# Patient Record
Sex: Female | Born: 1975 | Race: White | Hispanic: No | Marital: Married | Smoking: Current every day smoker
Health system: Southern US, Community
[De-identification: ages and names within clinical notes are randomized; demographics above are authoritative.]

## PROBLEM LIST (undated history)

## (undated) DIAGNOSIS — G43909 Migraine, unspecified, not intractable, without status migrainosus: Secondary | ICD-10-CM

## (undated) DIAGNOSIS — K219 Gastro-esophageal reflux disease without esophagitis: Secondary | ICD-10-CM

## (undated) DIAGNOSIS — G510 Bell's palsy: Secondary | ICD-10-CM

## (undated) DIAGNOSIS — G600 Hereditary motor and sensory neuropathy: Secondary | ICD-10-CM

## (undated) DIAGNOSIS — I10 Essential (primary) hypertension: Secondary | ICD-10-CM

## (undated) HISTORY — PX: KNEE SURGERY: SHX244

## (undated) HISTORY — PX: TONSILLECTOMY: SUR1361

## (undated) HISTORY — PX: APPENDECTOMY: SHX54

---

## 2007-09-11 ENCOUNTER — Emergency Department: Payer: Self-pay | Admitting: Unknown Physician Specialty

## 2008-07-08 ENCOUNTER — Ambulatory Visit: Payer: Self-pay | Admitting: Internal Medicine

## 2009-09-13 ENCOUNTER — Inpatient Hospital Stay: Payer: Self-pay | Admitting: Internal Medicine

## 2013-06-09 ENCOUNTER — Ambulatory Visit: Payer: Self-pay | Admitting: Surgery

## 2013-06-09 LAB — CBC WITH DIFFERENTIAL/PLATELET
Eosinophil #: 0 10*3/uL (ref 0.0–0.7)
HCT: 43.5 % (ref 35.0–47.0)
Lymphocyte #: 1.5 10*3/uL (ref 1.0–3.6)
Lymphocyte %: 22 %
MCHC: 34.2 g/dL (ref 32.0–36.0)
MCV: 104 fL — ABNORMAL HIGH (ref 80–100)
Monocyte #: 0.4 x10 3/mm (ref 0.2–0.9)
Neutrophil #: 4.7 10*3/uL (ref 1.4–6.5)
Neutrophil %: 70.7 %
Platelet: 260 10*3/uL (ref 150–440)
RBC: 4.19 10*6/uL (ref 3.80–5.20)
RDW: 12.3 % (ref 11.5–14.5)

## 2013-06-09 LAB — COMPREHENSIVE METABOLIC PANEL
Albumin: 4.4 g/dL (ref 3.4–5.0)
Alkaline Phosphatase: 103 U/L (ref 50–136)
BUN: 6 mg/dL — ABNORMAL LOW (ref 7–18)
Bilirubin,Total: 0.4 mg/dL (ref 0.2–1.0)
Calcium, Total: 8.7 mg/dL (ref 8.5–10.1)
Co2: 27 mmol/L (ref 21–32)
Creatinine: 0.6 mg/dL (ref 0.60–1.30)
EGFR (African American): >60
EGFR (Non-African Amer.): >60
Osmolality: 255 (ref 275–301)
Potassium: 4.5 mmol/L (ref 3.5–5.1)
SGOT(AST): 24 U/L (ref 15–37)
Total Protein: 7.6 g/dL (ref 6.4–8.2)

## 2013-06-09 LAB — LIPASE, BLOOD: Lipase: 309 U/L (ref 73–393)

## 2013-06-17 ENCOUNTER — Ambulatory Visit: Payer: Self-pay | Admitting: Surgery

## 2013-09-26 ENCOUNTER — Ambulatory Visit: Payer: Self-pay | Admitting: Family Medicine

## 2013-09-26 LAB — PREGNANCY, URINE: PREGNANCY TEST, URINE: NEGATIVE m[IU]/mL

## 2013-10-30 ENCOUNTER — Ambulatory Visit: Payer: Self-pay | Admitting: Family Medicine

## 2014-02-04 ENCOUNTER — Ambulatory Visit: Payer: Self-pay | Admitting: Family Medicine

## 2014-09-08 ENCOUNTER — Ambulatory Visit: Payer: Self-pay

## 2015-01-21 ENCOUNTER — Encounter: Payer: Self-pay | Admitting: Family Medicine

## 2015-01-21 ENCOUNTER — Ambulatory Visit
Admission: EM | Admit: 2015-01-21 | Discharge: 2015-01-21 | Disposition: A | Discharge status: Home / Self Care | Payer: BLUE CROSS/BLUE SHIELD

## 2015-01-21 DIAGNOSIS — R42 Dizziness and giddiness: Secondary | ICD-10-CM

## 2015-01-21 DIAGNOSIS — H6593 Unspecified nonsuppurative otitis media, bilateral: Secondary | ICD-10-CM | POA: Diagnosis not present

## 2015-01-21 DIAGNOSIS — R51 Headache: Secondary | ICD-10-CM

## 2015-01-21 DIAGNOSIS — R519 Headache, unspecified: Secondary | ICD-10-CM

## 2015-01-21 HISTORY — DX: Gastro-esophageal reflux disease without esophagitis: K21.9

## 2015-01-21 HISTORY — DX: Migraine, unspecified, not intractable, without status migrainosus: G43.909

## 2015-01-21 NOTE — Discharge Instructions (Signed)
Otitis Media With Effusion Otitis media with effusion is the presence of fluid in the middle ear. This is a common problem in children, which often follows ear infections. It may be present for weeks or longer after the infection. Unlike an acute ear infection, otitis media with effusion refers only to fluid behind the ear drum and not infection. Children with repeated ear and sinus infections and allergy problems are the most likely to get otitis media with effusion. CAUSES  The most frequent cause of the fluid buildup is dysfunction of the eustachian tubes. These are the tubes that drain fluid in the ears to the back of the nose (nasopharynx). SYMPTOMS   The main symptom of this condition is hearing loss. As a result, you or your child may:  Listen to the TV at a loud volume.  Not respond to questions.  Ask "what" often when spoken to.  Mistake or confuse one sound or word for another.  There may be a sensation of fullness or pressure but usually not pain. DIAGNOSIS   Your health care provider will diagnose this condition by examining you or your child's ears.  Your health care provider may test the pressure in you or your child's ear with a tympanometer.  A hearing test may be conducted if the problem persists. TREATMENT   Treatment depends on the duration and the effects of the effusion.  Antibiotics, decongestants, nose drops, and cortisone-type drugs (tablets or nasal spray) may not be helpful.  Children with persistent ear effusions may have delayed language or behavioral problems. Children at risk for developmental delays in hearing, learning, and speech may require referral to a specialist earlier than children not at risk.  You or your child's health care provider may suggest a referral to an ear, nose, and throat surgeon for treatment. The following may help restore normal hearing:  Drainage of fluid.  Placement of ear tubes (tympanostomy tubes).  Removal of adenoids  (adenoidectomy). HOME CARE INSTRUCTIONS   Avoid secondhand smoke.  Infants who are breastfed are less likely to have this condition.  Avoid feeding infants while they are lying flat.  Avoid known environmental allergens.  Avoid people who are sick. SEEK MEDICAL CARE IF:   Hearing is not better in 3 months.  Hearing is worse.  Ear pain.  Drainage from the ear.  Dizziness. MAKE SURE YOU:   Understand these instructions.  Will watch your condition.  Will get help right away if you are not doing well or get worse. Document Released: 09/21/2004 Document Revised: 12/29/2013 Document Reviewed: 03/11/2013 [facility] Patient Information 2015 [place], [place]. This information is not intended to replace advice given to you by your health care provider. Make sure you discuss any questions you have with your health care provider. Vertigo Vertigo means you feel like you or your surroundings are moving when they are not. Vertigo can be dangerous if it occurs when you are at work, driving, or performing difficult activities.  CAUSES  Vertigo occurs when there is a conflict of signals sent to your brain from the visual and sensory systems in your body. There are many different causes of vertigo, including:  Infections, especially in the inner ear.  A bad reaction to a drug or misuse of alcohol and medicines.  Withdrawal from drugs or alcohol.  Rapidly changing positions, such as lying down or rolling over in bed.  A migraine headache.  Decreased blood flow to the brain.  Increased pressure in the brain from a head  injury, infection, tumor, or bleeding. SYMPTOMS  You may feel as though the world is spinning around or you are falling to the ground. Because your balance is upset, vertigo can cause nausea and vomiting. You may have involuntary eye movements (nystagmus). DIAGNOSIS  Vertigo is usually diagnosed by physical exam. If the cause of your vertigo is unknown, your caregiver may  perform imaging tests, such as an MRI scan (magnetic resonance imaging). TREATMENT  Most cases of vertigo resolve on their own, without treatment. Depending on the cause, your caregiver may prescribe certain medicines. If your vertigo is related to body position issues, your caregiver may recommend movements or procedures to correct the problem. In rare cases, if your vertigo is caused by certain inner ear problems, you may need surgery. HOME CARE INSTRUCTIONS   Follow your caregiver's instructions.  Avoid driving.  Avoid operating heavy machinery.  Avoid performing any tasks that would be dangerous to you or others during a vertigo episode.  Tell your caregiver if you notice that certain medicines seem to be causing your vertigo. Some of the medicines used to treat vertigo episodes can actually make them worse in some people. SEEK IMMEDIATE MEDICAL CARE IF:   Your medicines do not relieve your vertigo or are making it worse.  You develop problems with talking, walking, weakness, or using your arms, hands, or legs.  You develop severe headaches.  Your nausea or vomiting continues or gets worse.  You develop visual changes.  A family member notices behavioral changes.  Your condition gets worse. MAKE SURE YOU:  Understand these instructions.  Will watch your condition.  Will get help right away if you are not doing well or get worse. Document Released: 05/24/2005 Document Revised: 11/06/2011 Document Reviewed: 03/02/2011 [facility] Patient Information 2015 [place], [place]. This information is not intended to replace advice given to you by your health care provider. Make sure you discuss any questions you have with your health care provider. General Headache Without Cause A headache is pain or discomfort felt around the head or neck area. The specific cause of a headache may not be found. There are many causes and types of headaches. A few common ones are:  Tension  headaches.  Migraine headaches.  Cluster headaches.  Chronic daily headaches. HOME CARE INSTRUCTIONS   Keep all follow-up appointments with your caregiver or any specialist referral.  Only take over-the-counter or prescription medicines for pain or discomfort as directed by your caregiver.  Lie down in a dark, quiet room when you have a headache.  Keep a headache journal to find out what may trigger your migraine headaches. For example, write down:  What you eat and drink.  How much sleep you get.  Any change to your diet or medicines.  Try massage or other relaxation techniques.  Put ice packs or heat on the head and neck. Use these 3 to 4 times per day for 15 to 20 minutes each time, or as needed.  Limit stress.  Sit up straight, and do not tense your muscles.  Quit smoking if you smoke.  Limit alcohol use.  Decrease the amount of caffeine you drink, or stop drinking caffeine.  Eat and sleep on a regular schedule.  Get 7 to 9 hours of sleep, or as recommended by your caregiver.  Keep lights dim if bright lights bother you and make your headaches worse. SEEK MEDICAL CARE IF:   You have problems with the medicines you were prescribed.  Your medicines are not  working.  You have a change from the usual headache.  You have nausea or vomiting. SEEK IMMEDIATE MEDICAL CARE IF:   Your headache becomes severe.  You have a fever.  You have a stiff neck.  You have loss of vision.  You have muscular weakness or loss of muscle control.  You start losing your balance or have trouble walking.  You feel faint or pass out.  You have severe symptoms that are different from your first symptoms. MAKE SURE YOU:   Understand these instructions.  Will watch your condition.  Will get help right away if you are not doing well or get worse. Document Released: 08/14/2005 Document Revised: 11/06/2011 Document Reviewed: 08/30/2011 [facility] Patient Information 2015  [place], [place]. This information is not intended to replace advice given to you by your health care provider. Make sure you discuss any questions you have with your health care provider.

## 2015-01-21 NOTE — ED Provider Notes (Signed)
CSN: [id number]     Arrival date & time 01/21/15  1430 History   None    Chief Complaint  Patient presents with  . Headache    yesterday  . Dizziness   (Consider location/radiation/quality/duration/timing/severity/associated sxs/prior Treatment) HPI Comments: [name] with new worst headache of life started yesterday hasn't relieved with home treatment spouse made her come and get seen.  Reported migraines and vertigo as child but none recently.  Seasonal allergic rhinitis not really bothering her.  Bright light doesn't make symptoms better or worse.  Holding pressure on temples/forehead makes it feel a little better.  Lying down makes vertigo worse.  Nauseas.  Patient skin warm slightly moist to touch.  Patient anxious in exam room.  Has taken multiple doses tylenol and motrin over past 24 hours without any relief of symptoms.  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  Denied trauma, fall, injury, illness, sickness.  [name] presenting with headaches and dizziness. The history is provided by the patient.  Headache Pain location:  L parietal, R parietal and occipital Quality:  Sharp Radiates to: occipital. Severity currently:  10/10 Severity at highest:  10/10 Onset quality:  Sudden Duration:  18 hours Timing:  Constant Progression:  Unchanged Chronicity:  New Similar to prior headaches: no   Context: activity, coughing, defecating, eating, emotional stress, loud noise and straining   Context: not exposure to bright light, not caffeine, not exposure to cold air and not intercourse   Relieved by:  Nothing Worsened by:  Activity Ineffective treatments:  NSAIDs, acetaminophen and resting in a darkened room Associated symptoms: dizziness, nausea, paresthesias, sore throat and tingling   Associated symptoms: no abdominal pain, no back pain, no blurred vision, no congestion, no cough, no diarrhea, no  drainage, no ear pain, no eye pain, no facial pain, no fatigue, no fever, no focal weakness, no hearing loss, no loss of balance, no myalgias, no near-syncope, no neck pain, no neck stiffness, no numbness, no photophobia, no seizures, no sinus pressure, no swollen glands, no syncope, no URI, no visual change, no vomiting and no weakness   Dizziness Associated symptoms: headaches and nausea   Associated symptoms: no blood in stool, no chest pain, no diarrhea, no hearing loss, no palpitations, no shortness of breath, no syncope, no tinnitus, no vision changes, no vomiting and no weakness     Past Medical History  Diagnosis Date  . GERD (gastroesophageal reflux disease)   . Migraines    Past Surgical History  Procedure Laterality Date  . Knee surgery    . Tonsillectomy    . Appendectomy     History reviewed. No pertinent family history. History  Substance Use Topics  . Smoking status: Current Every Day Smoker -- 0.50 packs/day    Types: Cigarettes  . Smokeless tobacco: Not on file  . Alcohol Use: 3.6 oz/week    6 Cans of beer per week   OB History    No data available     Review of Systems  Constitutional: Negative for fever, chills, diaphoresis, activity change, appetite change and fatigue.  HENT: Positive for sore throat. Negative for congestion, ear discharge, ear pain, facial swelling, hearing loss, mouth sores, nosebleeds, postnasal drip, rhinorrhea, sinus pressure, sneezing, tinnitus, trouble swallowing and voice change.   Eyes: Negative for blurred vision, photophobia and pain.  Respiratory: Negative for cough, shortness of breath, wheezing and stridor.   Cardiovascular:  Negative for chest pain, palpitations, leg swelling, syncope and near-syncope.  Gastrointestinal: Positive for nausea. Negative for vomiting, abdominal pain, diarrhea, constipation, blood in stool, abdominal distention and anal bleeding.  Endocrine: Negative for cold intolerance and heat intolerance.   Genitourinary: Negative for dysuria, hematuria and menstrual problem.  Musculoskeletal: Negative for myalgias, back pain, joint swelling, arthralgias, gait problem, neck pain and neck stiffness.  Allergic/Immunologic: Positive for environmental allergies. Negative for food allergies and immunocompromised state.  Neurological: Positive for dizziness, headaches and paresthesias. Negative for tremors, focal weakness, seizures, syncope, facial asymmetry, speech difficulty, weakness, light-headedness, numbness and loss of balance.  Hematological: Negative for adenopathy. Does not bruise/bleed easily.  Psychiatric/Behavioral: Negative for hallucinations, behavioral problems, confusion, sleep disturbance, decreased concentration and agitation.    Allergies  Sulfur  Home Medications   Prior to Admission medications   Medication Sig Start Date End Date Taking? Authorizing Provider  levonorgestrel (MIRENA) 20 MCG/24HR IUD 1 each by Intrauterine route once.   Yes Historical Provider, MD  medroxyPROGESTERone (DEPO-SUBQ PROVERA 104) 104 MG/0.65ML injection Inject 104 mg into the skin every 3 (three) months.   Yes Historical Provider, MD  omeprazole (PRILOSEC) 20 MG capsule Take 20 mg by mouth daily.   Yes Historical Provider, MD   BP 147/100 mmHg  Pulse 105  Temp(Src) 98.1 F (36.7 C) (Oral)  Resp 18  Ht 5\' 10"  (1.778 m)  Wt 145 lb (65.772 kg)  BMI 20.81 kg/m2  SpO2 99% Physical Exam  Constitutional: She is oriented to person, place, and time. Vital signs are normal. She appears well-developed and well-nourished.  HENT:  Head: Normocephalic and atraumatic.  Right Ear: Hearing, tympanic membrane and external ear normal.  Left Ear: Hearing, tympanic membrane, external ear and ear canal normal.  Mouth/Throat: Oropharynx is clear and moist. No oropharyngeal exudate.  Eyes: Conjunctivae, EOM and lids are normal. Pupils are equal, round, and reactive to light. Right eye exhibits no discharge, no  exudate and no hordeolum. Left eye exhibits no discharge, no exudate and no hordeolum. Right conjunctiva is not injected. Right conjunctiva has no hemorrhage. Left conjunctiva is not injected. Left conjunctiva has no hemorrhage. No scleral icterus. Right eye exhibits normal extraocular motion and no nystagmus. Left eye exhibits normal extraocular motion and no nystagmus. Right pupil is round and reactive. Left pupil is round and reactive. Pupils are equal.  Neck: Trachea normal and normal range of motion. Neck supple. No JVD present. No tracheal deviation present. No thyromegaly present.  Cardiovascular: Normal rate, regular rhythm, normal heart sounds and intact distal pulses.   Pulmonary/Chest: Effort normal and breath sounds normal.  Abdominal: Soft. She exhibits no shifting dullness, no distension, no pulsatile liver, no fluid wave, no abdominal bruit, no ascites, no pulsatile midline mass and no mass. Bowel sounds are decreased. There is tenderness in the right upper quadrant, epigastric area and left upper quadrant. There is no rigidity, no rebound, no guarding, no CVA tenderness, no tenderness at McBurney's point and negative Murphy's sign.  Dull to percussion all quads  Musculoskeletal: Normal range of motion. She exhibits no edema or tenderness.  Lymphadenopathy:    She has no cervical adenopathy.  Neurological: She is alert and oriented to person, place, and time. No cranial nerve deficit. Coordination normal.  Skin: Skin is warm and intact. No rash noted. No erythema. No pallor.  Psychiatric: She has a normal mood and affect. Her speech is normal and behavior is normal. Judgment and thought content normal. Cognition and memory are normal.  Nursing note and vitals reviewed.   ED Course  Procedures (including critical care time) Labs Review Labs Reviewed - No data to display  Imaging Review No results found.   MDM   1. Acute intractable headache, unspecified headache type   2.  Vertigo   3. Otitis media with effusion, bilateral     Patient with worst headache of her life and nausea.  Patient refused nausea medications at this time.  CT scan unavailable at our facility at this time.  Discussed with patient recommend CT scan at ER now with POV transport by friend or family.  Patient prefers to go to [facility] report called to [place] at [phone] via telephone.   Recommended patient not to drive as vertigo and distracted due to headache.  Unable to give toradol due to took motrin and tylenol multiple times today.  Holding on triptans due to elevated blood pressure.  Discussed with patient CVA, TIA, elevated cranial pressure, brain bleed, migraine, infection can cause worst headache of life symptoms and further evaluation today recommended.  For typical headache/migraine symptoms recommend rest, and intermittent application of heat, analgesics, and PRN po NSAIDS.  Avoid known triggers e.g. sleep deprivation, foods, stress, dehydration.  If headache is the worst headache of entire life and came on like a clap of thunder patient was instructed to go to the Emergency Room.  Call or return to clinic as needed if these symptoms worsen or fail to improve as anticipated.  Exitcare handout on headaches given to patient and patient also instructed to maintain headache log.  Patient verbalized agreement and understanding of treatment plan. P2:  Diet and fitness Vertigo secondary to otitis media with effusion, headache, seasonal allergies.  Discussed meclizine sometimes effective but patient currently does not have driver with her and does not want to wait at facility for driver to arrive.  Patient verbalized understanding of information and had no further questions at this time Supportive treatment.   No evidence of invasive bacterial infection, non toxic and well hydrated.  This is most likely self limiting viral infection.  I do not see where any further testing or imaging is necessary at this  time.   I will suggest supportive care, rest, good hygiene and encourage the patient to take adequate fluids.  The patient is to return to clinic or EMERGENCY ROOM if symptoms worsen or change significantly e.g. ear pain, fever, purulent discharge from ears or bleeding.  Exitcare handout on otitis media with effusion given to patient.  Patient verbalized agreement and understanding of treatment plan.    Olen Cordial, NP 01/21/15 1559

## 2015-01-21 NOTE — ED Notes (Signed)
Patient states she has a severe headache that started yesterday. She says that when she holds her head up the room is spinning, when she is laying down the room is still spinning. She is not nauseous and has not vomited. She has a history of migraines in the past, but has never been this dizzy.

## 2015-01-27 ENCOUNTER — Encounter: Payer: Self-pay | Admitting: Emergency Medicine

## 2015-01-27 ENCOUNTER — Ambulatory Visit
Admission: EM | Admit: 2015-01-27 | Discharge: 2015-01-27 | Disposition: A | Discharge status: Other Acute Inpatient Hospital | Payer: BLUE CROSS/BLUE SHIELD | Attending: Family Medicine | Admitting: Family Medicine

## 2015-01-27 DIAGNOSIS — R51 Headache: Secondary | ICD-10-CM | POA: Diagnosis not present

## 2015-01-27 DIAGNOSIS — R519 Headache, unspecified: Secondary | ICD-10-CM

## 2015-01-27 HISTORY — DX: Bell's palsy: G51.0

## 2015-01-27 NOTE — ED Notes (Signed)
Patient c/o HA ongoing for a week.  Patient reports facial droop last night.  Patient denies N/V.

## 2015-01-27 NOTE — Discharge Instructions (Signed)
Given patient's symptoms recommend going to a local ER for further evaluation and treatment. Will likely need imaging of her head. Patient wants her husband to take her, declines any transport.

## 2015-01-27 NOTE — ED Provider Notes (Signed)
Patient presents today with a history of a headache for the last week. Patient reports having facial droop since last night on the right side. Patient denies any nausea or vomiting. Patient denies any visual symptoms, weakness of any extremities, abnormal gait. Patient was seen here previously for headache last month and was sent to [facility] for imaging. Patient states that she had a family emergency and was not able to stay for any imaging at [facility]. Patient admits to having Bell's palsy when she was 49. Patient has elevated blood pressure in the office today. Patient states that she normally has elevated blood pressure and is currently not under any treatment for it. Patient does smoke and drinks alcohol regularly.  Review systems negative except mentioned above. Vitals as per chart General-no apparent distress NEURO-facial droop noted on right side, describes tingling sensation on the right side of face, tongue is midline, PERRL, EOMI, 5/5 strength of all extremities, normal gait  A/P: Headache/Facial Symptoms-discussed with patient that I would recommend a CT of her head, patient declines any type of transport and states that her husband will take her to the hospital ER, discussed with patient that her symptoms could be related to Bell's palsy but given her headache I would recommend further evaluation. Patient addresses understanding.  Paulina Fusi, MD 01/27/15 1059

## 2015-08-17 ENCOUNTER — Ambulatory Visit
Admission: EM | Admit: 2015-08-17 | Discharge: 2015-08-17 | Disposition: A | Discharge status: Home / Self Care | Payer: BLUE CROSS/BLUE SHIELD | Attending: Family Medicine | Admitting: Family Medicine

## 2015-08-17 DIAGNOSIS — S0012XA Contusion of left eyelid and periocular area, initial encounter: Secondary | ICD-10-CM | POA: Diagnosis not present

## 2015-08-17 DIAGNOSIS — S0512XA Contusion of eyeball and orbital tissues, left eye, initial encounter: Secondary | ICD-10-CM

## 2015-08-17 DIAGNOSIS — J342 Deviated nasal septum: Secondary | ICD-10-CM

## 2015-08-17 DIAGNOSIS — Q670 Congenital facial asymmetry: Secondary | ICD-10-CM | POA: Diagnosis not present

## 2015-08-17 DIAGNOSIS — S0081XA Abrasion of other part of head, initial encounter: Secondary | ICD-10-CM | POA: Diagnosis not present

## 2015-08-17 HISTORY — DX: Hereditary motor and sensory neuropathy: G60.0

## 2015-08-17 HISTORY — DX: Essential (primary) hypertension: I10

## 2015-08-17 NOTE — ED Provider Notes (Signed)
CSN: [id number]     Arrival date & time 08/17/15  0945 History   First MD Initiated Contact with Patient 08/17/15 1023     Chief Complaint  Patient presents with  . Fall   (Consider location/radiation/quality/duration/timing/severity/associated sxs/prior Treatment) HPI   This a 39 year old female who states that she fell on Friday 4 days prior to being seen today when she was at her company's Christmas party; her legs "gave out" and she fell forward flat on her face. It does not appear that she attempted to brake her fall as she has no other injuries other than her facial abrasions. Is not known if she had loss of consciousness. States that she fell onto a cement floor. She states that the pain has increased over time. She has numerous abrasions over her nose ,fore head and she has mild swelling of her left eye with periorbital ecchymosis. Denies any visual disturbances. She has a history of Bell's palsy which has been evaluated by neurologist who states that she has permanent damage to the muscles and their function will not return. In addition she also has a history of Charcot-Marie-Tooth disease with a peripheral neuropathy and decreased sensation in the lower extremities as well as decreased proprioception.  Past Medical History  Diagnosis Date  . GERD (gastroesophageal reflux disease)   . Migraines   . Bell palsy   . Charcot-Marie-Tooth disease   . Hypertension    Past Surgical History  Procedure Laterality Date  . Knee surgery    . Tonsillectomy    . Appendectomy    . Knee surgery Right    History reviewed. No pertinent family history. Social History  Substance Use Topics  . Smoking status: Current Every Day Smoker -- 0.50 packs/day    Types: Cigarettes  . Smokeless tobacco: None  . Alcohol Use: 3.6 oz/week    6 Cans of beer per week     Comment: socially   OB History    No data available     Review of Systems  Constitutional: Positive for activity change. Negative  for fever, chills, diaphoresis and fatigue.  HENT: Positive for facial swelling.   Neurological: Positive for facial asymmetry and weakness.  All other systems reviewed and are negative.   Allergies  Sulfa antibiotics and Sulfur  Home Medications   Prior to Admission medications   Medication Sig Start Date End Date Taking? Authorizing Provider  levonorgestrel (MIRENA) 20 MCG/24HR IUD 1 each by Intrauterine route once.   Yes Historical Provider, MD  medroxyPROGESTERone (DEPO-SUBQ PROVERA 104) 104 MG/0.65ML injection Inject 104 mg into the skin every 3 (three) months.   Yes Historical Provider, MD  omeprazole (PRILOSEC) 20 MG capsule Take 20 mg by mouth daily.   Yes Historical Provider, MD   Meds Ordered and Administered this Visit  Medications - No data to display  BP 162/103 mmHg  Pulse 124  Temp(Src) 97 F (36.1 C) (Tympanic)  Resp 18  Ht 5\' 10"  (1.778 m)  Wt 140 lb (63.504 kg)  BMI 20.09 kg/m2  SpO2 100% No data found.   Physical Exam  Constitutional: She is oriented to person, place, and time. She appears well-developed and well-nourished. No distress.  HENT:  Examination of the patient's  Face shows an obvious deviation of the nasal septum to the left. Numerous abrasions of the left side of the face including the nose left forehead and periorbital. She has a large periorbital edema and ecchymosis. There is significant tenderness about this area  But no crepitus or palpable defect felt. Exam shows a lag of her upward gaze on the left and she complains of discomfort performing eye movement.  Eyes: Pupils are equal, round, and reactive to light. Right eye exhibits no discharge. Left eye exhibits no discharge.  See above  Neck: Normal range of motion. Neck supple.  Musculoskeletal: Normal range of motion. She exhibits no edema or tenderness.  Neurological: She is alert and oriented to person, place, and time.  Skin: Skin is warm and dry. She is not diaphoretic.  Psychiatric:  She has a normal mood and affect. Her behavior is normal. Judgment and thought content normal.  Nursing note and vitals reviewed.   ED Course  Procedures (including critical care time)  Labs Review Labs Reviewed - No data to display  Imaging Review No results found.   Visual Acuity Review  Right Eye Distance:   Left Eye Distance:   Bilateral Distance:    Right Eye Near:   Left Eye Near:    Bilateral Near:         MDM   1. Facial abrasion, initial encounter   2. Facial asymmetry   3. Periorbital contusion of left eye, initial encounter   4. Periorbital ecchymosis of left eye, initial encounter   5. Nasal septal deviation    I had discussion with the patient and told her that I do not have the availability of the CT scanner today. Provider options for evaluation at an emergency room and she has chosen [facility] as her choice. She will drive there today for evaluation and possibly imaging studies to rule out facial bone fractures. She left our facility in stable condition.    Lorin Picket, PA-C 08/17/15 1110

## 2015-08-17 NOTE — ED Notes (Addendum)
Hx of Lori Moon disease. States legs gave out on Friday night and fell with face landing on cement. Unknown LOC, Abrasion above left eye, Black eye left eye. + nasal swelling and bruising. C/o sever pain above and below left eye and upper nasal bones. Also states has been told in the past by PMD that has HTN but not on anything. Also states Dx with Bells Palsy 1 month ago

## 2015-08-26 ENCOUNTER — Other Ambulatory Visit: Payer: Self-pay | Admitting: Family Medicine

## 2015-08-26 ENCOUNTER — Other Ambulatory Visit: Payer: Self-pay | Admitting: Neurology

## 2015-08-26 DIAGNOSIS — G510 Bell's palsy: Secondary | ICD-10-CM

## 2015-08-26 DIAGNOSIS — G600 Hereditary motor and sensory neuropathy: Secondary | ICD-10-CM

## 2015-08-31 ENCOUNTER — Ambulatory Visit: Admission: RE | Admit: 2015-08-31 | Payer: BLUE CROSS/BLUE SHIELD | Source: Ambulatory Visit

## 2015-09-01 ENCOUNTER — Ambulatory Visit: Payer: BLUE CROSS/BLUE SHIELD | Admitting: Speech Pathology

## 2015-09-15 ENCOUNTER — Encounter: Payer: Self-pay | Admitting: Physical Therapy

## 2015-09-15 ENCOUNTER — Ambulatory Visit: Payer: BLUE CROSS/BLUE SHIELD | Attending: Neurology | Admitting: Physical Therapy

## 2015-09-15 DIAGNOSIS — R293 Abnormal posture: Secondary | ICD-10-CM | POA: Insufficient documentation

## 2015-09-15 DIAGNOSIS — M25669 Stiffness of unspecified knee, not elsewhere classified: Secondary | ICD-10-CM

## 2015-09-15 DIAGNOSIS — M6281 Muscle weakness (generalized): Secondary | ICD-10-CM | POA: Diagnosis present

## 2015-09-15 DIAGNOSIS — R262 Difficulty in walking, not elsewhere classified: Secondary | ICD-10-CM

## 2015-09-15 DIAGNOSIS — R29898 Other symptoms and signs involving the musculoskeletal system: Secondary | ICD-10-CM | POA: Insufficient documentation

## 2015-09-15 NOTE — Therapy (Signed)
[place] [facility] [address] [place], [place], [address] Phone: [phone]   Fax:  [phone]  Physical Therapy Evaluation  Patient Details  Name: Lori Moon MRN: [id number] Date of Birth: 25-Aug-1976 Referring Provider: Gurney Maxin  Encounter Date: 09/15/2015      PT End of Session - 09/16/15 1227    Visit Number 1   Number of Visits 4   PT Start Time 1346   PT Stop Time 1436   PT Time Calculation (min) 50 min   Equipment Utilized During Treatment Gait belt   Activity Tolerance Patient limited by pain;Patient limited by fatigue   Behavior During Therapy Anxious      Past Medical History  Diagnosis Date  . GERD (gastroesophageal reflux disease)   . Migraines   . Bell palsy   . Charcot-Marie-Tooth disease   . Hypertension     Past Surgical History  Procedure Laterality Date  . Knee surgery    . Tonsillectomy    . Appendectomy    . Knee surgery Right     There were no vitals filed for this visit.  Visit Diagnosis:  Difficulty walking  Abnormal posture  Muscle weakness-general  Decreased range of motion of lower extremity      Subjective Assessment - 09/15/15 1356    Subjective Pt. reports >1 year issue of B knee/lower leg pain and numbness.  Pt. states "it feels like a million needles are going through my lower legs/feet".     Patient is accompained by: Family member   Limitations Lifting;Standing;Walking   Patient Stated Goals Improve walking/ mobility with less pain.  Play with daughter.     Currently in Pain? Yes   Pain Score 10-Worst pain ever   Pain Location Knee   Pain Orientation Right;Left   Pain Descriptors / Indicators Numbness;Throbbing;Pins and needles   Pain Type Chronic pain   Pain Onset More than a month ago   Pain Frequency Constant   Multiple Pain Sites Yes   Pain Score 10   Pain Location Leg   Pain Orientation Right;Left;Lower   Pain Descriptors / Indicators Numbness;Pins  and needles;Throbbing            [facility] PT Assessment - 09/16/15 0001    Assessment   Medical Diagnosis Chronic pain of both LE   Referring Provider Gurney Maxin   Onset Date/Surgical Date 04/16/15   Next MD Visit 11/27/15  Pt states next MD appt is around 3 months from now   Balance Screen   Has the patient fallen in the past 6 months Yes   How many times? 5+   Has the patient had a decrease in activity level because of a fear of falling?  Yes   Is the patient reluctant to leave their home because of a fear of falling?  Yes            PT Education - 09/16/15 1600    Education provided Yes   Education Details Instructed in use of rollator with all walking at home/ outside   [facility]) Educated Patient   Methods Explanation;Demonstration   Comprehension Verbalized understanding      No there.ex on evaluation day due to extensive examination/ pain in B LE/ significant balance issues.          PT Long Term Goals - 09/16/15 1245    PT LONG TERM GOAL #1   Title Pt will increase LEFS score to > 30 out of 80  to decrease self-percieved disability/ increase mobility.   Baseline LEFS 7/80 1/18   Time 4   Period Weeks   Status New   PT LONG TERM GOAL #2   Title Pt will increase AROM B knee flex. to >105 deg. in order to keep up with daughter and increase functional mobility.   Baseline R/L knee flex: 99/91 deg.  1/18   Time 4   Period Weeks   Status New   PT LONG TERM GOAL #3   Title Pt will improve BERG score to >40/56 in order to ambulate safelty with use of rollator/ least restrictive device for home mobility.    Baseline BERG: 25/56 1/18   Time 4   Period Weeks   Status New   PT LONG TERM GOAL #4   Title Pt will increase B hip flexion strength to 3+/5 MMT in order to be able to ambulate with good biomechanics/ improve standing endurance.   Baseline R/L hip flexion: 2+/2+ 1/18   Time 4   Period Weeks   Status New            Plan - 09/16/15 1229     Clinical Impression Statement Pt is a 40 y/o female referred to PT for chronic pain in B LE. Pt states in August 2016 her feet went numb and then about 2 months ago her legs and thighs went numb as well. Pt has been exepriencing a gradual increase in pain over the past two months. Pt decribes the pain in B LE as throbbing and has not slept through the night in a long time. Pt reports only getting 4 hours of sleep last night due to pain. AROM: R knee flex: 99 deg. R knee ext: 0 deg. L knee flex: 91 deg. L knee ext: lacking 1 deg. PROM: R knee flex: 89 deg. L knee flex: 79 deg. Pt was extremely guarded and limited by pain with PROM.  MMT: R hip flex: 2+/5. L hip flex: 2+/5. R knee flex: 3/5 L knee flex: 3/5. R knee ext: 3/5 L knee ext: 3/5.  Pt. ambulates with significant antalgic gait pattern with R knee in flexed posture/ increase forward kyphosis and poor head positioning.  Pt. reports 10/10 pain with all standing and walking tasks with use of rollator.  Pt. scored 25/56 on Berg balance test (100% risk of falls).  Pt. will benefit from skilled PT services to increase B LE muscle strength to improve safelty/ independence with gait with least restrictive device.    Pt will benefit from skilled therapeutic intervention in order to improve on the following deficits Abnormal gait;Decreased activity tolerance;Decreased balance;Decreased coordination;Decreased endurance;Decreased range of motion;Decreased strength;Decreased mobility;Difficulty walking;Impaired tone;Impaired flexibility;Impaired sensation;Postural dysfunction;Improper body mechanics;Pain;Impaired perceived functional ability   Rehab Potential Fair   PT Frequency 1x / week   PT Duration 4 weeks   PT Treatment/Interventions ADLs/Self Care Home Management;Cryotherapy;Electrical Stimulation;Moist Heat;Ultrasound;Gait training;Stair training;Functional mobility training;Therapeutic activities;Therapeutic exercise;Balance training;Neuromuscular  re-education;Patient/family education;Wheelchair mobility training;Manual techniques;Passive range of motion   PT Next Visit Plan decrease pain/balance training/quad strengthening   PT Home Exercise Plan discussed activity level at home/ use of rollator   Consulted and Agree with Plan of Care Patient         Problem List There are no active problems to display for this patient.  Pura Spice, PT, DPT # [phone]   09/16/2015, 4:10 PM  [place] [facility] [address] [place], [place], [address] Phone: [phone]  Fax:  [phone]  Name: Lori Moon MRN: [id number] Date of Birth: 1976-08-13

## 2015-09-22 ENCOUNTER — Ambulatory Visit: Payer: BLUE CROSS/BLUE SHIELD | Admitting: Physical Therapy

## 2015-09-22 DIAGNOSIS — R262 Difficulty in walking, not elsewhere classified: Secondary | ICD-10-CM

## 2015-09-22 DIAGNOSIS — M6281 Muscle weakness (generalized): Secondary | ICD-10-CM

## 2015-09-22 DIAGNOSIS — M25669 Stiffness of unspecified knee, not elsewhere classified: Secondary | ICD-10-CM

## 2015-09-22 DIAGNOSIS — R293 Abnormal posture: Secondary | ICD-10-CM

## 2015-09-23 ENCOUNTER — Encounter: Payer: Self-pay | Admitting: Physical Therapy

## 2015-09-23 NOTE — Therapy (Signed)
[place] [facility] [address] [place], [place], [address] Phone: [phone]   Fax:  [phone]  Physical Therapy Treatment  Patient Details  Name: Lori Moon MRN: [id number] Date of Birth: July 23, 1976 Referring Provider: Gurney Maxin  Encounter Date: 09/22/2015      PT End of Session - 09/23/15 1743    Visit Number 2   Number of Visits 4   PT Start Time [id number]   PT Stop Time [id number]   PT Time Calculation (min) 47 min   Equipment Utilized During Treatment Gait belt   Activity Tolerance Patient limited by pain;Patient limited by fatigue   Behavior During Therapy Anxious      Past Medical History  Diagnosis Date  . GERD (gastroesophageal reflux disease)   . Migraines   . Bell palsy   . Charcot-Marie-Tooth disease   . Hypertension     Past Surgical History  Procedure Laterality Date  . Knee surgery    . Tonsillectomy    . Appendectomy    . Knee surgery Right     There were no vitals filed for this visit.  Visit Diagnosis:  Difficulty walking  Abnormal posture  Muscle weakness-general  Decreased range of motion of lower extremity      Subjective Assessment - 09/23/15 1736    Subjective Pt. states she is still in significant >10/10 pain in B lower legs (from mid-thigh to feet).  Pt. has been prescribed Tramadol with no benefit reported since starting last week.  Pt. entered PT with use of SPC and severe antalgic gait pattern.     Limitations Lifting;Standing;Walking   Patient Stated Goals Improve walking/ mobility with less pain.  Play with daughter.     Currently in Pain? Yes   Pain Score 10-Worst pain ever   Pain Location Knee   Pain Orientation Right;Left   Pain Descriptors / Indicators Numbness;Throbbing;Pins and needles   Pain Type Chronic pain       OBJECTIVE:  There.ex.: Supine B LE/ hip stretches (hamstring/ piriformis/ trunk rotn./ gastroc/ ankle).  Supine SAQ with bolster/ hip flexion/ partial  bolster bridging (all with PT assist) 10x each. Walking in //-bars with UE assist working on consistent step pattern/ upright posture with mirror feedback.  Wt. Shifting on/off Airex in //-bars with UE assist.  Pt. Has difficulty with any standing ex. Without UE assist.  Significant fatigue.     Pt response for medical necessity: Persistent B LE pain and muscle weakness/ fatigue with supine AROM and standing tasks.  Pt. Benefits from CGA with all walking and use of rollator for safety.  Pt. Has difficulty managing rollator with car and prefers using SPC at this time.  Pt. Is a high fall risk.        PT Long Term Goals - 09/16/15 1245    PT LONG TERM GOAL #1   Title Pt will increase LEFS score to > 30 out of 80 to decrease self-percieved disability/ increase mobility.   Baseline LEFS 7/80 1/18   Time 4   Period Weeks   Status New   PT LONG TERM GOAL #2   Title Pt will increase AROM B knee flex. to >105 deg. in order to keep up with daughter and increase functional mobility.   Baseline R/L knee flex: 99/91 deg.  1/18   Time 4   Period Weeks   Status New   PT LONG TERM GOAL #3   Title Pt will improve BERG score to >  40/56 in order to ambulate safelty with use of rollator/ least restrictive device for home mobility.    Baseline BERG: 25/56 1/18   Time 4   Period Weeks   Status New   PT LONG TERM GOAL #4   Title Pt will increase B hip flexion strength to 3+/5 MMT in order to be able to ambulate with good biomechanics/ improve standing endurance.   Baseline R/L hip flexion: 2+/2+ 1/18   Time 4   Period Weeks   Status New             Plan - 09/23/15 1744    Clinical Impression Statement Pt. limited by significant B LE pain/ generalized muscle fatigue with transfers/ standing/ walking in //-bars.  Pt. has difficulty standing upright without UE assist.  Pt. requires manual assist with supine hip/knee AROM ex.     Pt will benefit from skilled therapeutic intervention in order to  improve on the following deficits Abnormal gait;Decreased activity tolerance;Decreased balance;Decreased coordination;Decreased endurance;Decreased range of motion;Decreased strength;Decreased mobility;Difficulty walking;Impaired tone;Impaired flexibility;Impaired sensation;Postural dysfunction;Improper body mechanics;Pain;Impaired perceived functional ability   Rehab Potential Fair   PT Frequency 1x / week   PT Duration 4 weeks   PT Treatment/Interventions ADLs/Self Care Home Management;Cryotherapy;Electrical Stimulation;Moist Heat;Ultrasound;Gait training;Stair training;Functional mobility training;Therapeutic activities;Therapeutic exercise;Balance training;Neuromuscular re-education;Patient/family education;Wheelchair mobility training;Manual techniques;Passive range of motion   PT Next Visit Plan decrease pain/balance training/quad strengthening   PT Home Exercise Plan discussed activity level at home/ use of rollator        Problem List There are no active problems to display for this patient.  Pura Spice, PT, DPT # [phone]   09/23/2015, 5:47 PM  [facility] [address] [place], [place], [address] Phone: [phone]   Fax:  [phone]  Name: Lori Moon MRN: [id number] Date of Birth: 1976/04/03

## 2015-09-29 ENCOUNTER — Ambulatory Visit: Payer: BLUE CROSS/BLUE SHIELD | Attending: Neurology | Admitting: Physical Therapy

## 2015-09-29 ENCOUNTER — Encounter: Payer: Self-pay | Admitting: Physical Therapy

## 2015-09-29 DIAGNOSIS — R29898 Other symptoms and signs involving the musculoskeletal system: Secondary | ICD-10-CM | POA: Insufficient documentation

## 2015-09-29 DIAGNOSIS — R262 Difficulty in walking, not elsewhere classified: Secondary | ICD-10-CM

## 2015-09-29 DIAGNOSIS — R293 Abnormal posture: Secondary | ICD-10-CM

## 2015-09-29 DIAGNOSIS — M6281 Muscle weakness (generalized): Secondary | ICD-10-CM | POA: Diagnosis present

## 2015-09-29 DIAGNOSIS — M25669 Stiffness of unspecified knee, not elsewhere classified: Secondary | ICD-10-CM

## 2015-09-29 NOTE — Therapy (Signed)
[place] [facility] [address] [place], [place], [address] Phone: [phone]   Fax:  [phone]  Physical Therapy Treatment  Patient Details  Name: Lori Moon MRN: [id number] Date of Birth: November 10, 1975 Referring Provider: Gurney Maxin  Encounter Date: 09/29/2015      PT End of Session - 09/29/15 1736    Visit Number 3   Number of Visits 4   PT Start Time [id number]   PT Stop Time 1520   PT Time Calculation (min) 46 min   Equipment Utilized During Treatment Gait belt   Activity Tolerance Patient limited by pain;Patient limited by fatigue   Behavior During Therapy Anxious      Past Medical History  Diagnosis Date  . GERD (gastroesophageal reflux disease)   . Migraines   . Bell palsy   . Charcot-Marie-Tooth disease   . Hypertension     Past Surgical History  Procedure Laterality Date  . Knee surgery    . Tonsillectomy    . Appendectomy    . Knee surgery Right     There were no vitals filed for this visit.  Visit Diagnosis:  Difficulty walking  Abnormal posture  Muscle weakness-general  Decreased range of motion of lower extremity      Subjective Assessment - 09/29/15 1732    Subjective Pt reports 10/10 pain at mid thigh down to feet with constant pain all weekend. Pt continues to ambulate with SPC and severe antalgic gait pattern   Patient is accompained by: Family member   Limitations Lifting;Standing;Walking   Patient Stated Goals Improve walking/ mobility with less pain.  Play with daughter.     Currently in Pain? Yes   Pain Score 10-Worst pain ever   Pain Location Leg   Pain Orientation Left;Right   Pain Descriptors / Indicators Tingling;Pins and needles   Pain Type Chronic pain   Pain Onset More than a month ago   Pain Frequency Constant       Objective: Manual: Proximal hamstring stretch B LE x 3 for 30 seconds each. Piriformis stretch B LE x 3 for 20 seconds each. Ther ex on B LE: Quad sets x 3.  Clamshells in supine with yellow tb x 5. Hip adduction squeeze with towel x 4. Bridges x 3 limited by pain and weakness. Long arc quad x 3. Seated marches x 10. Heel raises x 3.  Attempted toe raises limited by pain and muscle weakness. Seated hamstring and gastroc stretches x 1 for 20 seconds. Core activation on blue therapy ball with anterior and posterior pelvic tilts x 8 with postural cueing.  Pt response to treatment for medical necessity: Pt benefits from skilled PT in order to increase flexibility and ROM in order to ambulate safely with SPC. Pt will benefit from strengthening in order to improve functional mobility and endurance with standing tasks at home.        PT Education - 09/29/15 1735    Education provided Yes   Education Details see above HEP   Person(s) Educated Patient   Methods Explanation;Demonstration;Handout   Comprehension Verbalized understanding;Returned demonstration             PT Long Term Goals - 09/16/15 1245    PT LONG TERM GOAL #1   Title Pt will increase LEFS score to > 30 out of 80 to decrease self-percieved disability/ increase mobility.   Baseline LEFS 7/80 1/18   Time 4   Period Weeks   Status New  PT LONG TERM GOAL #2   Title Pt will increase AROM B knee flex. to >105 deg. in order to keep up with daughter and increase functional mobility.   Baseline R/L knee flex: 99/91 deg.  1/18   Time 4   Period Weeks   Status New   PT LONG TERM GOAL #3   Title Pt will improve BERG score to >40/56 in order to ambulate safelty with use of rollator/ least restrictive device for home mobility.    Baseline BERG: 25/56 1/18   Time 4   Period Weeks   Status New   PT LONG TERM GOAL #4   Title Pt will increase B hip flexion strength to 3+/5 MMT in order to be able to ambulate with good biomechanics/ improve standing endurance.   Baseline R/L hip flexion: 2+/2+ 1/18   Time 4   Period Weeks   Status New            Plan - 09/29/15 1737    Clinical  Impression Statement Pt is limited secondary to severe B LE pain and muscle weakness. Pt demonstrates tight proximal hamstrings with pain at end range. Pt struggles with seated marches due to muscles weakness and pain. Pt is unable to complete toes raises stating "my ankle muscles are locked,  I can't move them." Pt reports increase in pain at anterior tib with B PROM dorsiflexion. Pt requires maximal verbal and tactile postural cueing on blue therapy ball. Pt was unable to achieve straight posture due to pain and stiffness.    Pt will benefit from skilled therapeutic intervention in order to improve on the following deficits Abnormal gait;Decreased activity tolerance;Decreased balance;Decreased coordination;Decreased endurance;Decreased range of motion;Decreased strength;Decreased mobility;Difficulty walking;Impaired tone;Impaired flexibility;Impaired sensation;Postural dysfunction;Improper body mechanics;Pain;Impaired perceived functional ability   Rehab Potential Fair   PT Frequency 1x / week   PT Duration 4 weeks   PT Treatment/Interventions ADLs/Self Care Home Management;Cryotherapy;Electrical Stimulation;Moist Heat;Ultrasound;Gait training;Stair training;Functional mobility training;Therapeutic activities;Therapeutic exercise;Balance training;Neuromuscular re-education;Patient/family education;Wheelchair mobility training;Manual techniques;Passive range of motion   PT Next Visit Plan decrease pain/balance training/quad strengthening   PT Home Exercise Plan discussed activity level at home/ use of rollator/ HEP program above   Consulted and Agree with Plan of Care Patient        Problem List There are no active problems to display for this patient.  Pura Spice, PT, DPT # [phone]   09/30/2015, 8:52 AM   [facility] [address] [place], [place], [address] Phone: [phone]   Fax:  [phone]  Name: Lori Moon MRN: [id number] Date  of Birth: 02-03-1976

## 2015-10-06 ENCOUNTER — Encounter: Payer: BLUE CROSS/BLUE SHIELD | Admitting: Physical Therapy

## 2015-10-13 ENCOUNTER — Ambulatory Visit: Payer: BLUE CROSS/BLUE SHIELD | Admitting: Physical Therapy

## 2015-10-13 ENCOUNTER — Encounter: Payer: Self-pay | Admitting: Physical Therapy

## 2015-10-13 DIAGNOSIS — R262 Difficulty in walking, not elsewhere classified: Secondary | ICD-10-CM | POA: Diagnosis not present

## 2015-10-13 DIAGNOSIS — M25669 Stiffness of unspecified knee, not elsewhere classified: Secondary | ICD-10-CM

## 2015-10-13 DIAGNOSIS — M6281 Muscle weakness (generalized): Secondary | ICD-10-CM

## 2015-10-13 DIAGNOSIS — R293 Abnormal posture: Secondary | ICD-10-CM

## 2015-10-13 NOTE — Therapy (Signed)
[facility] [address]. [place], [place], [address] Phone: [phone]   Fax:  [phone]  Physical Therapy Treatment  Patient Details  Name: Lori Moon MRN: [id number] Date of Birth: [birth year] Referring Provider: Gurney Maxin  Encounter Date: 10/13/2015      PT End of Session - 10/13/15 1734    Visit Number 4   Number of Visits 8   Date for PT Re-Evaluation 11/10/15   PT Start Time [id number]   PT Stop Time 1522   PT Time Calculation (min) 48 min   Equipment Utilized During Treatment Gait belt   Activity Tolerance Patient limited by pain;Patient limited by fatigue   Behavior During Therapy Anxious      Past Medical History  Diagnosis Date  . GERD (gastroesophageal reflux disease)   . Migraines   . Bell palsy   . Charcot-Marie-Tooth disease   . Hypertension     Past Surgical History  Procedure Laterality Date  . Knee surgery    . Tonsillectomy    . Appendectomy    . Knee surgery Right     There were no vitals filed for this visit.  Visit Diagnosis:  Difficulty walking  Abnormal posture  Muscle weakness-general  Decreased range of motion of lower extremity      Subjective Assessment - 10/13/15 1730    Subjective Pt reports 8/10 pain in B LE. Pt reports she has been doing her HEP but does forget sometimes. Pt also states she saw MD last week and was given pain medication (oxycodone) that helps decrease the pain a little.   Patient is accompained by: Family member   Limitations Lifting;Standing;Walking   Patient Stated Goals Improve walking/ mobility with less pain.  Play with daughter.     Currently in Pain? Yes   Pain Score 8    Pain Location Leg   Pain Orientation Right;Left   Pain Descriptors / Indicators Tingling;Pins and needles   Pain Type Chronic pain   Pain Onset More than a month ago   Pain Frequency Constant       Objective: Manual: Proximal hamstring stretch. PROM B knee flexion x  2 for 30 seconds: Ther ex: Isometric holds x 5 for 5 seconds each. Supine hip abd with yellow tb x 20 with verbal and tactile cueing for good eccentric control. Supine ball squeezes x 20. Forwards step ups on 3" step x 20 with verbal and tactile postural cueing.  Pt response to treatment for medical necessity: Pt benefits from skilled PT in order to increase flexibility and ROM in order to ambulate safely with SPC. Pt will benefit from strengthening in order to improve functional mobility and endurance with standing tasks at home.  LEFS: 11/80 BERG: 32/56 R knee AROM:  -2-108 deg. L knee AROM: -1-117 deg.  MMT R hip flex: 3/5 L hip flex: 3/5       PT Long Term Goals - 10/13/15 1756    PT LONG TERM GOAL #1   Title Pt will increase LEFS score to > 30 out of 80 to decrease self-percieved disability/ increase mobility.   Baseline LEFS 7/80 on 1/18. LEFS 11/80 on 2/15   Time 4   Period Weeks   Status On-going   PT LONG TERM GOAL #2   Title Pt will increase AROM B knee flex. to >105 deg. in order to keep up with daughter and increase functional mobility.   Baseline R/L knee flex: 99/91 deg. 1/18  Time 4   Period Weeks   Status Achieved   PT LONG TERM GOAL #3   Title Pt will improve BERG score to >40/56 in order to ambulate safelty with use of rollator/ least restrictive device for home mobility.    Baseline BERG: 32/56 on 2/15   Time 4   Period Weeks   Status On-going   PT LONG TERM GOAL #4   Title Pt will increase B hip flexion strength to 3+/5 MMT in order to be able to ambulate with good biomechanics/ improve standing endurance.   Baseline R/L hip flexion: 2+/2+ 1/18. R/L hip flexion: 3/3 2/15.    Time 4   Period Weeks   Status On-going   PT LONG TERM GOAL #5   Title Pt will increase AROM B knee flex. to >120 deg. in order to safely ascend/descend stairs.   Baseline R/L knee flex: 108/117 deg on 2/15.   Time 4   Period Weeks   Status New           Plan - 10/13/15  1753    Clinical Impression Statement Pt continues to complain of constant B LE pain with movement. Pt requires frequent rest breaks but is able to take them in a standing position as opposed to seated. Pt requires verbal and tactile postural cueing throughout treatment session. Pt demonstrates increased proximal hamstring flexibility. BERG: 32/56. LEFS: 11/80. Hip MMT: R hip flex: 3/5. L hip flex: 3/5. R knee AROM: -2-108 deg.with pain at end range. L knee AROM: 1-117 deg. with pain at end range.    Pt will benefit from skilled therapeutic intervention in order to improve on the following deficits Abnormal gait;Decreased activity tolerance;Decreased balance;Decreased coordination;Decreased endurance;Decreased range of motion;Decreased strength;Decreased mobility;Difficulty walking;Impaired tone;Impaired flexibility;Impaired sensation;Postural dysfunction;Improper body mechanics;Pain;Impaired perceived functional ability   Rehab Potential Fair   PT Frequency 1x / week   PT Duration 4 weeks   PT Treatment/Interventions ADLs/Self Care Home Management;Cryotherapy;Electrical Stimulation;Moist Heat;Ultrasound;Gait training;Stair training;Functional mobility training;Therapeutic activities;Therapeutic exercise;Balance training;Neuromuscular re-education;Patient/family education;Wheelchair mobility training;Manual techniques;Passive range of motion   PT Next Visit Plan decrease pain/balance training/quad strengthening   PT Home Exercise Plan discussed activity level at home/ use of rollator/ HEP program above   Consulted and Agree with Plan of Care Patient        Problem List There are no active problems to display for this patient.   Georgina Pillion, SPT 10/13/2015, 6:05 PM  [facility] [address] [place], [place], [address] Phone: [phone]   Fax:  [phone]  Name: Lori Moon MRN: [id number] Date of Birth: Nov 30, 1975

## 2015-10-20 ENCOUNTER — Encounter: Payer: BLUE CROSS/BLUE SHIELD | Admitting: Physical Therapy

## 2015-10-27 ENCOUNTER — Encounter: Payer: BLUE CROSS/BLUE SHIELD | Admitting: Physical Therapy

## 2015-11-03 ENCOUNTER — Encounter: Payer: BLUE CROSS/BLUE SHIELD | Admitting: Physical Therapy

## 2015-11-10 ENCOUNTER — Telehealth: Payer: Self-pay | Admitting: *Deleted

## 2015-11-10 ENCOUNTER — Encounter: Payer: BLUE CROSS/BLUE SHIELD | Admitting: Physical Therapy

## 2015-11-10 NOTE — Telephone Encounter (Signed)
LM MADE PT AWARE THAT I WOULD LOVE TO SCHEDULE HER FOR AN APPT. I ASKED HER TO PLEASE GIVE ME A CALL BACK. I LEFT MY NAME AND NUMBER.Marland KitchenMarland KitchenTD

## 2016-01-19 ENCOUNTER — Ambulatory Visit: Payer: BLUE CROSS/BLUE SHIELD | Admitting: Pain Medicine

## 2016-01-20 ENCOUNTER — Ambulatory Visit: Payer: BLUE CROSS/BLUE SHIELD | Admitting: Pain Medicine

## 2017-11-13 ENCOUNTER — Encounter: Payer: Self-pay | Admitting: Emergency Medicine

## 2017-11-13 ENCOUNTER — Other Ambulatory Visit: Payer: Self-pay

## 2017-11-13 ENCOUNTER — Ambulatory Visit
Admission: EM | Admit: 2017-11-13 | Discharge: 2017-11-13 | Disposition: A | Discharge status: Home / Self Care | Payer: Medicaid Other | Attending: Family Medicine | Admitting: Family Medicine

## 2017-11-13 ENCOUNTER — Ambulatory Visit: Payer: Medicaid Other

## 2017-11-13 DIAGNOSIS — M549 Dorsalgia, unspecified: Secondary | ICD-10-CM | POA: Diagnosis present

## 2017-11-13 DIAGNOSIS — S22000A Wedge compression fracture of unspecified thoracic vertebra, initial encounter for closed fracture: Secondary | ICD-10-CM

## 2017-11-13 DIAGNOSIS — W19XXXA Unspecified fall, initial encounter: Secondary | ICD-10-CM | POA: Diagnosis not present

## 2017-11-13 DIAGNOSIS — Z79899 Other long term (current) drug therapy: Secondary | ICD-10-CM | POA: Diagnosis not present

## 2017-11-13 DIAGNOSIS — M4854XA Collapsed vertebra, not elsewhere classified, thoracic region, initial encounter for fracture: Secondary | ICD-10-CM | POA: Insufficient documentation

## 2017-11-13 DIAGNOSIS — Z882 Allergy status to sulfonamides status: Secondary | ICD-10-CM | POA: Insufficient documentation

## 2017-11-13 DIAGNOSIS — R0781 Pleurodynia: Secondary | ICD-10-CM

## 2017-11-13 DIAGNOSIS — F17210 Nicotine dependence, cigarettes, uncomplicated: Secondary | ICD-10-CM | POA: Diagnosis not present

## 2017-11-13 MED ORDER — CALCITONIN (SALMON) 200 UNIT/ACT NA SOLN: 1.0000 | Freq: Every day | NASAL | 0 refills | Status: DC

## 2017-11-13 MED ORDER — KETOROLAC TROMETHAMINE 60 MG/2ML IM SOLN
60.0000 mg | Freq: Once | INTRAMUSCULAR | Status: AC
  Administered 2017-11-13: 60 mg via INTRAMUSCULAR

## 2017-11-13 NOTE — ED Triage Notes (Signed)
Patient in today stating she fell Sunday 11/11/17 and is now having rib pain and back pain. Patient states she broke her pelvis and coccyx ~ 6 weeks ago. Patient also has CMT disease.

## 2017-11-13 NOTE — ED Provider Notes (Signed)
[facility]  CSN: [id number] Arrival date & time: 11/13/17  1125  History   Chief Complaint Chief Complaint  Patient presents with  . Back Pain    HPI  42 year old female with an extensive past medical history presents with rib pain and upper back pain.  Patient states that she fell 2 days ago while in the bathroom.  She landed on the floor.  She states that since then she has had bilateral rib pain as well as lower thoracic and upper thoracic back pain.  Pain is severe.  She is currently taking narcotics from a pain clinic in [place].  Patient is concerned that she may have suffered a fracture.  She has recently fractured her pelvis as well as her sacrum after suffering a fall.  She is currently under orthopedic care.  Worse with activity.  No relieving factors.  No other associated symptoms.  No other complaints or concerns at this time.  Past Medical History:  Diagnosis Date  . Bell palsy   . Charcot-Marie-Tooth disease   . GERD (gastroesophageal reflux disease)   . Hypertension   . Migraines    Past Surgical History:  Procedure Laterality Date  . APPENDECTOMY    . KNEE SURGERY    . KNEE SURGERY Right   . TONSILLECTOMY     OB History    No data available     Home Medications    Prior to Admission medications   Medication Sig Start Date End Date Taking? Authorizing Provider  clindamycin (CLEOCIN) 300 MG capsule Take 300 mg by mouth 3 (three) times daily.   Yes [provider]  levonorgestrel (MIRENA) 20 MCG/24HR IUD 1 each by Intrauterine route once.   Yes [provider]  nicotine (NICODERM CQ - DOSED IN MG/24 HOURS) 14 mg/24hr patch Place 14 mg onto the skin daily.   Yes [provider]  omeprazole (PRILOSEC) 20 MG capsule Take 20 mg by mouth daily.   Yes [provider]  Oxycodone HCl 10 MG TABS Take 10 mg by mouth 3 (three) times daily as needed (pain).   Yes [provider]  calcitonin, salmon,  (MIACALCIN/FORTICAL) 200 UNIT/ACT nasal spray Place 1 spray into alternate nostrils daily. 11/13/17   Coral Spikes, DO  medroxyPROGESTERone (DEPO-SUBQ PROVERA 104) 104 MG/0.65ML injection Inject 104 mg into the skin every 3 (three) months.    [provider]    Family History Family History  Problem Relation Age of Onset  . Diabetes Father   . Multiple sclerosis Father     Social History Social History   Tobacco Use  . Smoking status: Current Every Day Smoker    Packs/day: 0.50    Types: Cigarettes  . Smokeless tobacco: Never Used  Substance Use Topics  . Alcohol use: Yes    Alcohol/week: 8.4 oz    Types: 14 Cans of beer per week  . Drug use: No     Allergies   Sulfa antibiotics and Sulfur   Review of Systems Review of Systems  Constitutional: Negative for fever.  Respiratory: Negative for shortness of breath.   Musculoskeletal: Positive for back pain.       Rib pain.   Physical Exam Triage Vital Signs ED Triage Vitals  Enc Vitals Group     BP 11/13/17 1143 (!) 135/92     Pulse Rate 11/13/17 1143 (!) 120     Resp 11/13/17 1143 20     Temp 11/13/17 1143 98.6 F (37 C)  Temp Source 11/13/17 1143 Oral     SpO2 11/13/17 1143 100 %     Weight 11/13/17 1144 111 lb (50.3 kg)     Height 11/13/17 1144 5\' 9"  (1.753 m)     Head Circumference --      Peak Flow --      Pain Score 11/13/17 1142 9     Pain Loc --      Pain Edu? --      Excl. in [place]? --    Updated Vital Signs BP (!) 135/92 (BP Location: Left Arm)   Pulse (!) 120   Temp 98.6 F (37 C) (Oral)   Resp 20   Ht 5\' 9"  (1.753 m)   Wt 111 lb (50.3 kg)   SpO2 100%   BMI 16.39 kg/m   Physical Exam  Constitutional: She is oriented to person, place, and time. She appears well-developed.  Appears in distress secondary to pain.  Cardiovascular: Normal rate and regular rhythm.  Pulmonary/Chest: Effort normal and breath sounds normal. She has no wheezes. She has no rales.  Musculoskeletal:    Patient with bilateral rib to palpation. Thoracic spine and paraspinal muscles are exquisitely tender to palpation.  Neurological: She is alert and oriented to person, place, and time.  Psychiatric: She has a normal mood and affect. Her behavior is normal.  Nursing note and vitals reviewed.  UC Treatments / Results  Labs (all labs ordered are listed, but only abnormal results are displayed) Labs Reviewed - No data to display  EKG  EKG Interpretation None      Radiology Dg Thoracic Spine 2 View  Result Date: 11/13/2017 CLINICAL DATA:  Fall 2 days ago. EXAM: THORACIC SPINE 2 VIEWS COMPARISON:  Rib series 09/08/2014.  CT chest 10/30/2013. FINDINGS: Diffuse degenerative change thoracic spine. Diffuse osteopenia. Mild scratched it multiple mild mid and lower thoracic vertebral body compression fractures. Although these may be old these appear to be new from 2016. Ill-defined density incidentally noted in the right upper lung. PA lateral chest x-ray suggested for further evaluation. Biapical pleural thickening noted consistent scarring. IMPRESSION: 1. Multiple mid lower thoracic vertebral body mild compression fractures. Although these may be old, these appear to be new from prior study of 09/08/2014. Diffuse osteopenia and degenerative change. 2. Ill-defined density incidentally noted in the right upper lung, PA and lateral chest x-ray suggested for further evaluation. Electronically Signed   By: Marcello Moores  Register   On: 11/13/2017 12:40   Procedures Procedures (including critical care time)  Medications Ordered in UC Medications  ketorolac (TORADOL) injection 60 mg (60 mg Intramuscular Given 11/13/17 1212)   Initial Impression / Assessment and Plan / UC Course  I have reviewed the triage vital signs and the nursing notes.  Pertinent labs & imaging results that were available during my care of the patient were reviewed by me and considered in my medical decision making (see chart for  details).     42 year old female presents with vertebral fractures.  Patient likely has osteoporosis.  Osteopenia noted on x-ray.  Treating with calcitonin.  Patient is to continue her home pain medication.  I did not give her additional pain medication today as it would violate her pain contract.  Additionally, there was a spot noted in the right lung on her x-ray.  Radiologist recommended follow-up PA and lateral chest x-ray.  I discussed this with the patient and the patient elected to wait.  I gave her a copy of her imaging report as  well as her image itself.  She is to follow-up with her primary.  Final Clinical Impressions(s) / UC Diagnoses   Final diagnoses:  Closed compression fracture of thoracic vertebra, initial encounter [facility])  Rib pain    ED Discharge Orders        Ordered    calcitonin, salmon, (MIACALCIN/FORTICAL) 200 UNIT/ACT nasal spray  Daily     11/13/17 1255     Controlled Substance Prescriptions [place] Controlled Substance Registry consulted? Not Applicable   Coral Spikes, DO 11/13/17 1304

## 2017-11-13 NOTE — Discharge Instructions (Signed)
Call your doctor regarding your pain medication.  Please follow up soon.  Medication as prescribed.  Take care  Dr. Lacinda Axon

## 2017-11-15 ENCOUNTER — Other Ambulatory Visit: Payer: Self-pay | Admitting: Student

## 2017-11-15 ENCOUNTER — Ambulatory Visit
Admission: RE | Admit: 2017-11-15 | Discharge: 2017-11-15 | Disposition: A | Discharge status: Home / Self Care | Payer: Medicaid Other | Source: Ambulatory Visit | Attending: Student | Admitting: Student

## 2017-11-15 DIAGNOSIS — M545 Low back pain, unspecified: Secondary | ICD-10-CM

## 2017-11-15 DIAGNOSIS — S22060A Wedge compression fracture of T7-T8 vertebra, initial encounter for closed fracture: Secondary | ICD-10-CM

## 2017-11-15 DIAGNOSIS — S3210XD Unspecified fracture of sacrum, subsequent encounter for fracture with routine healing: Secondary | ICD-10-CM

## 2017-11-16 ENCOUNTER — Ambulatory Visit: Admission: RE | Admit: 2017-11-16 | Payer: Medicaid Other | Source: Ambulatory Visit

## 2017-12-10 ENCOUNTER — Telehealth: Payer: Self-pay | Admitting: Physical Therapy

## 2017-12-10 ENCOUNTER — Other Ambulatory Visit: Payer: Self-pay | Admitting: Student

## 2017-12-10 ENCOUNTER — Ambulatory Visit: Payer: Medicaid Other | Attending: Family Medicine | Admitting: Physical Therapy

## 2017-12-10 ENCOUNTER — Other Ambulatory Visit: Payer: Self-pay

## 2017-12-10 DIAGNOSIS — X58XXXD Exposure to other specified factors, subsequent encounter: Secondary | ICD-10-CM | POA: Diagnosis not present

## 2017-12-10 DIAGNOSIS — T07XXXA Unspecified multiple injuries, initial encounter: Secondary | ICD-10-CM

## 2017-12-10 DIAGNOSIS — R2689 Other abnormalities of gait and mobility: Secondary | ICD-10-CM | POA: Diagnosis present

## 2017-12-10 DIAGNOSIS — M545 Low back pain, unspecified: Secondary | ICD-10-CM

## 2017-12-10 DIAGNOSIS — S22060A Wedge compression fracture of T7-T8 vertebra, initial encounter for closed fracture: Secondary | ICD-10-CM

## 2017-12-10 DIAGNOSIS — R296 Repeated falls: Secondary | ICD-10-CM

## 2017-12-10 DIAGNOSIS — S3210XD Unspecified fracture of sacrum, subsequent encounter for fracture with routine healing: Secondary | ICD-10-CM

## 2017-12-10 DIAGNOSIS — R5381 Other malaise: Secondary | ICD-10-CM | POA: Diagnosis present

## 2017-12-10 DIAGNOSIS — M6281 Muscle weakness (generalized): Secondary | ICD-10-CM | POA: Insufficient documentation

## 2017-12-10 DIAGNOSIS — S32810D Multiple fractures of pelvis with stable disruption of pelvic ring, subsequent encounter for fracture with routine healing: Secondary | ICD-10-CM | POA: Diagnosis not present

## 2017-12-10 NOTE — Telephone Encounter (Signed)
Physical Therapist phoned the office of Dr. Mikle Bosworth and spoke with Jenny Reichmann re: pt is waiting to be contacted about scheduling her MRI. Jenny Reichmann voiced she will contact pt to get that scheduled. PT provided pt's mother in law ( current caretaker)  [phone] Jasmina Gendron as the number to call for pt.

## 2017-12-10 NOTE — Therapy (Addendum)
[place] MAIN [facility] SERVICES [address] [place], [place], [address] Phone: [phone]   Fax:  [phone]  Physical Therapy Evaluation  Patient Details  Name: Lori Moon MRN: [id number] Date of Birth: 1976/03/01 Referring Provider: Bobette Mo   Encounter Date: 12/10/2017  PT End of Session - 12/11/17 2157    Visit Number  1    Number of Visits  12    Date for PT Re-Evaluation  03/03/18    PT Start Time  1300    PT Stop Time  1400    PT Time Calculation (min)  60 min       Past Medical History:  Diagnosis Date  . Bell palsy   . Charcot-Marie-Tooth disease   . GERD (gastroesophageal reflux disease)   . Hypertension   . Migraines     Past Surgical History:  Procedure Laterality Date  . APPENDECTOMY    . KNEE SURGERY    . KNEE SURGERY Right   . TONSILLECTOMY      There were no vitals filed for this visit.   Subjective Assessment - 12/10/17 1319    Subjective Hx of Multiple falls and Fractures at pelvis and thoracic spine:  Pt had ER visit on 10/30/17 due to pelvic pain on the L side after a fall. ( Fall #1)   Two weeks later 11/13/17, Fall #2 occurred. Pt was trying to get up from the toilet while holding onto a towel bar. She fell as the towel bar came off the wall. Pt hit the front of the toilet and went down, her back when right into the back of the toilet.  Pt went to the Urgent Care.  Pt had an appt  with her orthopedic MD the next day of the fall.  Dr. Cameron Proud  took an Xray which showed multiple Fx in spine and pelvis. Pt is due for an MRI but they have not heard back from the office for an appointment.  Lack of mobility due to recent hospitalization: On 11/20/17, pt went to the ER due to increase swelling in BLE and feet and jaundice. Pt has stayed in the hospital for 12 days and underwent care for low levels of potassium, sodium, and other things that were low. Pt has recently quit alcohol / smoking 3 weeks ago on  11/20/17. Prior to that time, pt drinks 12 beers/day for the past 3 years since CMT Dx. During the hospital stay, pt got PNA while in the ICU. Pt stayed in the hospital for 2 weeks bed ridden without walking in the halls with staff. The swelling in her legs have been resolved. Pt has not slept through the night since she has been to the her hospital.     Urination/ bowel elimination difficulty: Prior to the hospitalization, pt has had difficulty with urination, sensing when to go and and she has to strain to go and has to concentrate. Pt has to concentrate to be able to go urinate and have a bowel movement. The ability to eliminate has worsened over the past 3 years. Pt has hemorrhoids. Bowel movements occurred everyday with straining.  During hospitalization, pt was catheterized the entire stay. After the hospitalization, pt's difficulty with urination remains the same but pt has constipation once every 3-4 days and she has started to take a stool softener. Pt has lost sensation to go use the bathroom when she got CMT disease ( Charcot Marie Tooth Disease). Pt is not able use the  bathroom until it gets so bad.  Pt has had leakage, Pt has been able to get to the bathroom with assistance by her mother in law. Pt uses a walker to get tothe bathroom IND. Pt had   Numbness/ tingling in her legs/ feet: She reports numbness/tingling in her legs and feet due to CMT, and pelvic pain and back pain due to the Fx after falls. This Sx was present prior to swelling of her legs prior to hospitalization. Numbness and tingling started 3 years ago in both feet suddenly and within a 1 week, the numbness and tingling went up her legs. When the numbness and tingling came on, pt had to walk while holding onto counters and wall. She relies on a RW at home.   Prior to the Dx, pt was able to ride motorcycle, walk, and drive and was working at desk job. Pt stopped working for the past 1 year.  Pt feels today, the numbness and tingling  has travelled up to her pubic area where her bladder is.   Pt had been smoking since the age of 66 yrs. Pt takes Percocet and Gabaopentin for pain. The medication takes "the edge off" but it does not take it away. It does not last that long.    Chronic back/pelvic pain : Pain today 6-7/10 after taking pain medication.  Prior to pain medications, 10/10.  Pt feels pain in her back and L pelvic area. See details to Fx above.    Patient is accompained by:  Family member    Limitations  Lifting;Standing;Walking    Patient Stated Goals  Improve walking/ mobility with less pain.  Play with daughter.      Pain Onset  More than a month ago         [facility] PT Assessment - 12/11/17 2153      Assessment   Medical Diagnosis  Closed pelvic ring Fx    Referring Provider  Sant[place]ana      Observation/Other Assessments   Observations  socks donned, no shoes. In  hospital WC.       Sensation   Additional Comments  poor sensation BLE  dermatome       Coordination   Gross Motor Movements are Fluid and Coordinated  -- diaphragmatic breathing achieved      Sit to Stand   Comments  mother-in-law provided mod assist,, proper body mechanics, no gait belt      Posture/Postural Control   Posture Comments  severe slumped posture      Strength   Overall Strength Comments  BLE 3-/5 hip/ knee/    2/5 B ankle      Palpation   Palpation comment  R foot in supination                 Objective measurements completed on examination: See above findings.      [place] [facility] PT Treatment/Exercise - 12/11/17 2152      Neuro Re-ed    Neuro Re-ed Details   diaphragmatic breathing, propping a pillow behind chair for upright posture, feet on floor                   PT Long Term Goals - 12/11/17 2201      PT LONG TERM GOAL #1   Title  Pt will receive education on bladder/ bowel health     Time  10    Period  Weeks    Status  New    Target  Date  02/19/18      PT LONG TERM GOAL #2   Title   Pt will decrease her PFDI score from 55% to <45  % in order to improve ADLs    Time  10    Period  Weeks    Status  New    Target Date  02/19/18      PT LONG TERM GOAL #3   Title  Pt will report less straining and ability to urinate in order to minimize UTI and promote bladder health    Time  8    Period  Weeks    Status  New    Target Date  02/05/18      PT LONG TERM GOAL #4   Title  Pt will demo IND with HEP with precautions for pelvic/spinal Fx adhered    Time  4    Period  Weeks    Status  New    Target Date  01/08/18      PT LONG TERM GOAL #5   Title  Pt will demo proper deep core strengthening level 2 in order to improve postural strengthening, bone/ mass building, and overall strengthening    Time  4    Period  Weeks    Status  New    Target Date  01/08/18             Plan - 12/11/17 2158    Clinical Impression Statement Pt is a 42 yo female who complains of multiple falls with untreated fractures in her spine and pelvis, numbness/tingling in BLE, CLPB, urinary retention/difficulty with elimination of bowels 2/2 Charcot Marie Tooth Disease. Pt also recently had a 12 day stay at the hospital starting on 11/20/17 due to leg swelling. Incurred PNA during hospital stay. Catherized during entire hospital stay. Pt had quit drinking alcohol since hospital admission. Prior to that time, pt was drinking 12 beers/day for the past 3 years since CMT Dx.  Since leaving the hospital, her BLE swelling and PNA resolved but constipation has worsened. Today pt presents in a WC, accompanied with her mother-in-law.  Medical records showed lab results that include abnormal WBC, anemia, and low electrolyte and x-rays indicating multiple thoracic mild compression fractures, osteopenia, and degenerative change. Need to f/u with MD re: pt's pelvic images.  MRI order is pending but pt and mother-in-law states they have not heard back from the office to schedule. PT communicated with Dr. Dwaine Gale  staff by phone re: pt needing an appt for MRI. Staff voiced they will contact pt.   PT assessment was abbreviated due to pt's complex Hx. Pt demo'd significant deconditioning with poor postural stability, weakness of BLE and ankles, requiring mod A from mother in law for sit to stand for toilet t/f. Pt was educated to perform diaphagmatic breathing for toileting to minimize straining with toileting and for postural strengthening. Pt was educated to utilize pillow behind back to minimize thoracic kyphosis and worsening/ risk of thoracic Fx. Pt and mother-in-law voiced understanding.   Withheld additional exercises/Tx today due to pt's Fx, anemia, and need for MRI findings. Plan to treat pelvic dysfunctions for several visits and refer to home PT due to pt's deconditioned state. Suspect pt has neurogenic bladder but e-stimulation for pelvic floor retraining will be contraindicated due to pelvic Fx. Plan to assess Pelvic PT Tx options at upcoming visit with considerations of MRI findings and communication with MD.     History and Personal Factors relevant to plan  of care: Multiple Fx 2/2 multiple Fx, Charcot Marie Tooth disease, Hx of alcohol abuse, hospitalization recently for 12 days with catherization/ bedbound , and PNA    Clinical Presentation  Unstable    Clinical Decision Making  High    Rehab Potential  Fair    PT Frequency  2x / week    PT Duration  4 weeks    PT Treatment/Interventions  Neuromuscular re-education;Therapeutic activities;Aquatic Therapy;Therapeutic exercise;Biofeedback;Moist Heat;Electrical Stimulation;Balance training;Stair training;Functional mobility training;Manual techniques;Manual lymph drainage;Scar mobilization;Energy conservation;Taping;Gait training;Prosthetic Training    Consulted and Agree with Plan of Care  Patient;Family member/caregiver    Family Member Consulted  mother in law       Patient will benefit from skilled therapeutic intervention in order to improve  the following deficits and impairments:  Abnormal gait, Decreased balance, Decreased endurance, Difficulty walking, Hypomobility, Improper body mechanics, Decreased strength, Decreased safety awareness, Decreased activity tolerance, Pain, Postural dysfunction  Visit Diagnosis: Muscle weakness (generalized)  Physical deconditioning  Other abnormalities of gait and mobility  Fractures  Repeated falls     Problem List There are no active problems to display for this patient.   Jerl Mina  ,PT, DPT, E-RYT  12/12/2017, 9:03 PM  [place] MAIN [facility] SERVICES [address] [place], [place], [address] Phone: [phone]   Fax:  [phone]  Name: NAILANI FULL MRN: [id number] Date of Birth: 05/29/1976

## 2017-12-11 NOTE — Patient Instructions (Signed)
Seated posture: Pillow behind back in chair to minimize slouching and risk for worsening of Fx Feet on ground/ supported  Diaphragmatic breathing with ribcage expansion Paced 1-2 pause inhale, 2-1 exhale pause

## 2017-12-12 NOTE — Addendum Note (Signed)
Addended by: Jerl Mina on: 12/12/2017 10:08 PM   Modules accepted: Orders

## 2017-12-14 ENCOUNTER — Ambulatory Visit: Payer: Medicaid Other

## 2017-12-17 ENCOUNTER — Ambulatory Visit: Payer: Medicaid Other | Admitting: Physical Therapy

## 2017-12-18 ENCOUNTER — Ambulatory Visit
Admission: RE | Admit: 2017-12-18 | Discharge: 2017-12-18 | Disposition: A | Discharge status: Home / Self Care | Payer: Medicaid Other | Source: Ambulatory Visit | Attending: Student | Admitting: Student

## 2017-12-18 DIAGNOSIS — S22060A Wedge compression fracture of T7-T8 vertebra, initial encounter for closed fracture: Secondary | ICD-10-CM

## 2017-12-18 DIAGNOSIS — X58XXXA Exposure to other specified factors, initial encounter: Secondary | ICD-10-CM | POA: Insufficient documentation

## 2017-12-18 DIAGNOSIS — X58XXXD Exposure to other specified factors, subsequent encounter: Secondary | ICD-10-CM | POA: Insufficient documentation

## 2017-12-18 DIAGNOSIS — M5136 Other intervertebral disc degeneration, lumbar region: Secondary | ICD-10-CM | POA: Insufficient documentation

## 2017-12-18 DIAGNOSIS — S3210XD Unspecified fracture of sacrum, subsequent encounter for fracture with routine healing: Secondary | ICD-10-CM | POA: Insufficient documentation

## 2017-12-18 DIAGNOSIS — M48061 Spinal stenosis, lumbar region without neurogenic claudication: Secondary | ICD-10-CM | POA: Insufficient documentation

## 2017-12-18 DIAGNOSIS — M545 Low back pain, unspecified: Secondary | ICD-10-CM

## 2017-12-18 DIAGNOSIS — M5124 Other intervertebral disc displacement, thoracic region: Secondary | ICD-10-CM | POA: Diagnosis not present

## 2017-12-25 ENCOUNTER — Ambulatory Visit: Payer: Medicaid Other | Admitting: Physical Therapy

## 2017-12-31 ENCOUNTER — Ambulatory Visit: Payer: Medicaid Other | Admitting: Physical Therapy

## 2017-12-31 ENCOUNTER — Encounter: Payer: Self-pay | Admitting: Physical Therapy

## 2017-12-31 DIAGNOSIS — M6281 Muscle weakness (generalized): Secondary | ICD-10-CM

## 2017-12-31 DIAGNOSIS — R5381 Other malaise: Secondary | ICD-10-CM

## 2017-12-31 DIAGNOSIS — R296 Repeated falls: Secondary | ICD-10-CM

## 2017-12-31 DIAGNOSIS — T07XXXA Unspecified multiple injuries, initial encounter: Secondary | ICD-10-CM

## 2017-12-31 DIAGNOSIS — R2689 Other abnormalities of gait and mobility: Secondary | ICD-10-CM

## 2017-12-31 NOTE — Therapy (Signed)
[place] [facility] SERVICES [address] [place], [place], [address] Phone: [phone]   Fax:  [phone]  Patient Details  Name: Lori Moon MRN: [id number] Date of Birth: 11-28-1975 Referring Provider:  No ref. provider found  Encounter Date: 12/31/2017  Discharge Summary    Pt is d/c at this time as front desk staff communicated with pt's mother-in-law who stated MD has advised physical therapy be put on hold at this time.   Jerl Mina ,PT, DPT, E-RYT 12/31/2017, 12:14 PM  [place] [facility] SERVICES [address] [place], [place], [address] Phone: [phone]   Fax:  [phone]

## 2018-01-01 ENCOUNTER — Other Ambulatory Visit: Payer: Self-pay | Admitting: Infectious Diseases

## 2018-01-01 DIAGNOSIS — L02415 Cutaneous abscess of right lower limb: Secondary | ICD-10-CM

## 2018-01-01 DIAGNOSIS — R197 Diarrhea, unspecified: Secondary | ICD-10-CM

## 2018-01-10 ENCOUNTER — Ambulatory Visit: Payer: Medicaid Other | Admitting: Physical Therapy

## 2018-01-22 ENCOUNTER — Ambulatory Visit
Admission: RE | Admit: 2018-01-22 | Discharge: 2018-01-22 | Disposition: A | Discharge status: Home / Self Care | Payer: Medicaid Other | Source: Ambulatory Visit | Attending: Infectious Diseases | Admitting: Infectious Diseases

## 2018-01-22 ENCOUNTER — Encounter (INDEPENDENT_AMBULATORY_CARE_PROVIDER_SITE_OTHER): Payer: Self-pay

## 2018-01-22 DIAGNOSIS — M7989 Other specified soft tissue disorders: Secondary | ICD-10-CM | POA: Insufficient documentation

## 2018-01-22 DIAGNOSIS — M8448XA Pathological fracture, other site, initial encounter for fracture: Secondary | ICD-10-CM | POA: Insufficient documentation

## 2018-01-22 DIAGNOSIS — R1909 Other intra-abdominal and pelvic swelling, mass and lump: Secondary | ICD-10-CM | POA: Insufficient documentation

## 2018-01-22 DIAGNOSIS — M438X6 Other specified deforming dorsopathies, lumbar region: Secondary | ICD-10-CM | POA: Insufficient documentation

## 2018-01-22 DIAGNOSIS — L02415 Cutaneous abscess of right lower limb: Secondary | ICD-10-CM | POA: Diagnosis not present

## 2018-01-22 DIAGNOSIS — S32059A Unspecified fracture of fifth lumbar vertebra, initial encounter for closed fracture: Secondary | ICD-10-CM | POA: Insufficient documentation

## 2018-01-22 DIAGNOSIS — R197 Diarrhea, unspecified: Secondary | ICD-10-CM | POA: Diagnosis present

## 2018-01-22 MED ORDER — IOPAMIDOL (ISOVUE-300) INJECTION 61%
150.0000 mL | Freq: Once | INTRAVENOUS | Status: AC | PRN
  Administered 2018-01-22: 115 mL via INTRAVENOUS

## 2018-08-06 ENCOUNTER — Ambulatory Visit: Payer: Medicaid Other

## 2018-08-06 ENCOUNTER — Encounter: Payer: Self-pay | Admitting: Emergency Medicine

## 2018-08-06 ENCOUNTER — Other Ambulatory Visit: Payer: Self-pay

## 2018-08-06 ENCOUNTER — Ambulatory Visit
Admission: EM | Admit: 2018-08-06 | Discharge: 2018-08-06 | Disposition: A | Discharge status: Home / Self Care | Payer: Medicaid Other | Attending: Family Medicine | Admitting: Family Medicine

## 2018-08-06 DIAGNOSIS — I10 Essential (primary) hypertension: Secondary | ICD-10-CM | POA: Insufficient documentation

## 2018-08-06 DIAGNOSIS — Z79899 Other long term (current) drug therapy: Secondary | ICD-10-CM | POA: Insufficient documentation

## 2018-08-06 DIAGNOSIS — K219 Gastro-esophageal reflux disease without esophagitis: Secondary | ICD-10-CM | POA: Insufficient documentation

## 2018-08-06 DIAGNOSIS — Z791 Long term (current) use of non-steroidal anti-inflammatories (NSAID): Secondary | ICD-10-CM | POA: Insufficient documentation

## 2018-08-06 DIAGNOSIS — S8392XA Sprain of unspecified site of left knee, initial encounter: Secondary | ICD-10-CM | POA: Insufficient documentation

## 2018-08-06 DIAGNOSIS — S8982XA Other specified injuries of left lower leg, initial encounter: Secondary | ICD-10-CM | POA: Diagnosis present

## 2018-08-06 DIAGNOSIS — X501XXA Overexertion from prolonged static or awkward postures, initial encounter: Secondary | ICD-10-CM | POA: Insufficient documentation

## 2018-08-06 DIAGNOSIS — F17210 Nicotine dependence, cigarettes, uncomplicated: Secondary | ICD-10-CM | POA: Insufficient documentation

## 2018-08-06 DIAGNOSIS — Z793 Long term (current) use of hormonal contraceptives: Secondary | ICD-10-CM | POA: Diagnosis not present

## 2018-08-06 DIAGNOSIS — Z882 Allergy status to sulfonamides status: Secondary | ICD-10-CM | POA: Diagnosis not present

## 2018-08-06 MED ORDER — MELOXICAM 15 MG PO TABS: 15.0000 mg | ORAL_TABLET | Freq: Every day | ORAL | 0 refills | Status: DC

## 2018-08-06 NOTE — ED Provider Notes (Signed)
[facility]    CSN: [id number] Arrival date & time: 08/06/18  1219     History   Chief Complaint Chief Complaint  Patient presents with  . Knee Injury    left DOI 08/02/18    HPI Lori Moon is a 42 y.o. female.   HPI  Past Medical History:  Diagnosis Date  . Bell palsy   . Charcot-Marie-Tooth disease   . GERD (gastroesophageal reflux disease)   . Hypertension   . Migraines     There are no active problems to display for this patient.   Past Surgical History:  Procedure Laterality Date  . APPENDECTOMY    . KNEE SURGERY    . KNEE SURGERY Right   . TONSILLECTOMY      OB History   None      Home Medications    Prior to Admission medications   Medication Sig Start Date End Date Taking? Authorizing Provider  levonorgestrel (MIRENA) 20 MCG/24HR IUD 1 each by Intrauterine route once.   Yes [provider]  medroxyPROGESTERone (DEPO-SUBQ PROVERA 104) 104 MG/0.65ML injection Inject 104 mg into the skin every 3 (three) months.   Yes [provider]  omeprazole (PRILOSEC) 20 MG capsule Take 20 mg by mouth daily.   Yes [provider]  Oxycodone HCl 10 MG TABS Take 10 mg by mouth 3 (three) times daily as needed (pain).   Yes [provider]  calcitonin, salmon, (MIACALCIN/FORTICAL) 200 UNIT/ACT nasal spray Place 1 spray into alternate nostrils daily. 11/13/17   Coral Spikes, DO  clindamycin (CLEOCIN) 300 MG capsule Take 300 mg by mouth 3 (three) times daily.    [provider]  meloxicam (MOBIC) 15 MG tablet Take 1 tablet (15 mg total) by mouth daily. 08/06/18   Norval Gable, MD  nicotine (NICODERM CQ - DOSED IN MG/24 HOURS) 14 mg/24hr patch Place 14 mg onto the skin daily.    [provider]    Family History Family History  Problem Relation Age of Onset  . Other Mother        unknown medical history  . Diabetes Father   . Multiple sclerosis Father     Social History Social History    Tobacco Use  . Smoking status: Current Some Day Smoker    Packs/day: 0.50    Types: Cigarettes  . Smokeless tobacco: Never Used  Substance Use Topics  . Alcohol use: Not Currently    Alcohol/week: 14.0 standard drinks    Types: 14 Cans of beer per week    Comment: patient quit drinking 10/2107  . Drug use: No     Allergies   Sulfa antibiotics and Sulfur   Review of Systems Review of Systems   Physical Exam Triage Vital Signs ED Triage Vitals  Enc Vitals Group     BP 08/06/18 1236 (!) 125/91     Pulse Rate 08/06/18 1236 (!) 126     Resp 08/06/18 1236 16     Temp 08/06/18 1236 98.1 F (36.7 C)     Temp Source 08/06/18 1236 Oral     SpO2 08/06/18 1236 100 %     Weight 08/06/18 1237 120 lb (54.4 kg)     Height 08/06/18 1237 5' 9.5" (1.765 m)     Head Circumference --      Peak Flow --      Pain Score 08/06/18 1235 7     Pain Loc --      Pain  Edu? --      Excl. in [place]? --    No data found.  Updated Vital Signs BP (!) 125/91 (BP Location: Right Arm)   Pulse (!) 120   Temp 98.1 F (36.7 C) (Oral)   Resp 16   Ht 5' 9.5" (1.765 m)   Wt 54.4 kg   SpO2 100%   BMI 17.47 kg/m   Visual Acuity Right Eye Distance:   Left Eye Distance:   Bilateral Distance:    Right Eye Near:   Left Eye Near:    Bilateral Near:     Physical Exam  Constitutional: She appears well-developed and well-nourished. No distress.  Musculoskeletal:       Left knee: She exhibits no swelling, no effusion, no ecchymosis, no deformity, no laceration, no erythema, normal alignment and normal patellar mobility. Tenderness (diffuse) found. No patellar tendon tenderness noted.  Skin: She is not diaphoretic.  Nursing note and vitals reviewed.    UC Treatments / Results  Labs (all labs ordered are listed, but only abnormal results are displayed) Labs Reviewed - No data to display  EKG None  Radiology Dg Knee Complete 4 Views Left  Result Date: 08/06/2018 CLINICAL DATA:  Twisted left  knee.  Pain EXAM: LEFT KNEE - COMPLETE 4+ VIEW COMPARISON:  None. FINDINGS: No evidence of fracture, dislocation, or joint effusion. No evidence of arthropathy or other focal bone abnormality. Soft tissues are unremarkable. IMPRESSION: Negative. Electronically Signed   By: Franchot Gallo M.D.   On: 08/06/2018 13:07    Procedures Procedures (including critical care time)  Medications Ordered in UC Medications - No data to display  Initial Impression / Assessment and Plan / UC Course  I have reviewed the triage vital signs and the nursing notes.  Pertinent labs & imaging results that were available during my care of the patient were reviewed by me and considered in my medical decision making (see chart for details).      Final Clinical Impressions(s) / UC Diagnoses   Final diagnoses:  Sprain of left knee, unspecified ligament, initial encounter    ED Prescriptions    Medication Sig Dispense Auth. Provider   meloxicam (MOBIC) 15 MG tablet Take 1 tablet (15 mg total) by mouth daily. 30 tablet Norval Gable, MD     1. x-ray results and diagnosis reviewed with patient 2. rx as per orders above; reviewed possible side effects, interactions, risks and benefits  3. Recommend supportive treatment with rest, ice 4. Follow-up with PCP 5. Follow up prn  Controlled Substance Prescriptions [place] Controlled Substance Registry consulted? Not Applicable   Norval Gable, MD 08/06/18 1336

## 2018-08-06 NOTE — ED Triage Notes (Signed)
Patient in today c/o of left knee injury/pain. Patient states that she was about to fall and twisted her knee.

## 2018-12-10 ENCOUNTER — Other Ambulatory Visit: Payer: Self-pay

## 2018-12-11 ENCOUNTER — Encounter: Payer: Self-pay | Admitting: Hematology and Oncology

## 2018-12-11 ENCOUNTER — Inpatient Hospital Stay: Payer: Medicare Other | Attending: Hematology and Oncology | Admitting: Hematology and Oncology

## 2018-12-11 ENCOUNTER — Inpatient Hospital Stay: Payer: Medicare Other

## 2018-12-11 VITALS — BP 136/82 | HR 84 | Temp 97.5°F | Resp 16 | Ht 69.0 in | Wt 130.0 lb

## 2018-12-11 DIAGNOSIS — G600 Hereditary motor and sensory neuropathy: Secondary | ICD-10-CM | POA: Diagnosis not present

## 2018-12-11 DIAGNOSIS — K219 Gastro-esophageal reflux disease without esophagitis: Secondary | ICD-10-CM | POA: Diagnosis not present

## 2018-12-11 DIAGNOSIS — D649 Anemia, unspecified: Secondary | ICD-10-CM

## 2018-12-11 DIAGNOSIS — Z793 Long term (current) use of hormonal contraceptives: Secondary | ICD-10-CM | POA: Diagnosis not present

## 2018-12-11 DIAGNOSIS — L89154 Pressure ulcer of sacral region, stage 4: Secondary | ICD-10-CM | POA: Diagnosis not present

## 2018-12-11 DIAGNOSIS — D473 Essential (hemorrhagic) thrombocythemia: Secondary | ICD-10-CM | POA: Insufficient documentation

## 2018-12-11 DIAGNOSIS — Z79899 Other long term (current) drug therapy: Secondary | ICD-10-CM | POA: Diagnosis not present

## 2018-12-11 DIAGNOSIS — F17210 Nicotine dependence, cigarettes, uncomplicated: Secondary | ICD-10-CM | POA: Diagnosis not present

## 2018-12-11 DIAGNOSIS — I10 Essential (primary) hypertension: Secondary | ICD-10-CM | POA: Insufficient documentation

## 2018-12-11 DIAGNOSIS — G629 Polyneuropathy, unspecified: Secondary | ICD-10-CM

## 2018-12-11 DIAGNOSIS — D638 Anemia in other chronic diseases classified elsewhere: Secondary | ICD-10-CM

## 2018-12-11 DIAGNOSIS — R5383 Other fatigue: Secondary | ICD-10-CM | POA: Diagnosis not present

## 2018-12-11 DIAGNOSIS — D75839 Thrombocytosis, unspecified: Secondary | ICD-10-CM

## 2018-12-11 DIAGNOSIS — G8929 Other chronic pain: Secondary | ICD-10-CM | POA: Insufficient documentation

## 2018-12-11 LAB — URINALYSIS, COMPLETE (UACMP) WITH MICROSCOPIC
Bilirubin Urine: NEGATIVE
Glucose, UA: NEGATIVE mg/dL
Hgb urine dipstick: NEGATIVE
Ketones, ur: NEGATIVE mg/dL
Nitrite: NEGATIVE
Protein, ur: NEGATIVE mg/dL
RBC / HPF: NONE SEEN RBC/hpf (ref 0–5)
Specific Gravity, Urine: <1.005 — ABNORMAL LOW (ref 1.005–1.030)
pH: 5.5 (ref 5.0–8.0)

## 2018-12-11 LAB — RETICULOCYTES
Immature Retic Fract: 8 % (ref 2.3–15.9)
RBC.: 3.52 MIL/uL — ABNORMAL LOW (ref 3.87–5.11)
Retic Count, Absolute: 54.9 10*3/uL (ref 19.0–186.0)
Retic Ct Pct: 1.6 % (ref 0.4–3.1)

## 2018-12-11 LAB — CBC WITH DIFFERENTIAL/PLATELET
Abs Immature Granulocytes: 0.05 10*3/uL (ref 0.00–0.07)
Basophils Absolute: 0.1 10*3/uL (ref 0.0–0.1)
Basophils Relative: 0 %
Eosinophils Absolute: 0.1 10*3/uL (ref 0.0–0.5)
Eosinophils Relative: 1 %
HCT: 31.8 % — ABNORMAL LOW (ref 36.0–46.0)
Hemoglobin: 10.5 g/dL — ABNORMAL LOW (ref 12.0–15.0)
Immature Granulocytes: 0 %
Lymphocytes Relative: 12 %
Lymphs Abs: 1.5 10*3/uL (ref 0.7–4.0)
MCH: 29.8 pg (ref 26.0–34.0)
MCHC: 33 g/dL (ref 30.0–36.0)
MCV: 90.3 fL (ref 80.0–100.0)
Monocytes Absolute: 0.7 10*3/uL (ref 0.1–1.0)
Monocytes Relative: 6 %
Neutro Abs: 10.3 10*3/uL — ABNORMAL HIGH (ref 1.7–7.7)
Neutrophils Relative %: 81 %
Platelets: 561 10*3/uL — ABNORMAL HIGH (ref 150–400)
RBC: 3.52 MIL/uL — ABNORMAL LOW (ref 3.87–5.11)
RDW: 13.3 % (ref 11.5–15.5)
WBC: 12.6 10*3/uL — ABNORMAL HIGH (ref 4.0–10.5)
nRBC: 0 % (ref 0.0–0.2)

## 2018-12-11 LAB — IRON AND TIBC
Iron: 26 ug/dL — ABNORMAL LOW (ref 28–170)
Saturation Ratios: 10 % — ABNORMAL LOW (ref 10.4–31.8)
TIBC: 252 ug/dL (ref 250–450)
UIBC: 226 ug/dL

## 2018-12-11 LAB — FOLATE: Folate: 55.2 ng/mL (ref >5.9)

## 2018-12-11 LAB — FERRITIN: Ferritin: 73 ng/mL (ref 11–307)

## 2018-12-11 LAB — SEDIMENTATION RATE: Sed Rate: 31 mm/hr — ABNORMAL HIGH (ref 0–22)

## 2018-12-11 NOTE — Progress Notes (Signed)
[facility]  [address] [place], [place] [address] Phone: [phone]  Fax: [phone]   Clinic day:  12/11/2018   Referring physician:  Zeb Comfort, MD   Chief Complaint: Lori Moon is a 43 y.o. female with a normocytic anemia who is referred in consultation by Dr. Mcneil Sober for assessment and management.  HPI:  The patient has a history of undifferentiated peripheral neuropathy, chronic pain and Charcot- Marie-Tooth disease.  She states that she has had "anemia going on for years" (since age 58).  Review of available labs, reveals anemia dating back to 05/17/2017.  Nadir hemoglobin was 6.3 on 11/24/2017.  MCV has ranged from 87 - 101.  Platelet count has been between 71,000 - 603,000.  Differential has been unremarkable.  Iron studies have also fluctuated with a ferritin of 920 and iron saturation of 98% on 11/22/2017 then a ferritin of 98 with an iron saturation of 8% on 03/15/2018.  Hepatitis A, B and C testing as well as HIV testing were negative in 10/2017.  The patient was admitted to [facility] from 09/28/2018 - 10/12/2018 with influenza A well as multi focal pneumonia pneumonia due to streptococcus.  She was noted to have sepsis, hyponatremia and acute renal failure.  She was intubated between 02/03 - 10/06/2017.  She completed a course of Tamiflu and ceftriaxone.  She was also treated for alcohol withdrawal with a phenobarbital protocol.  She was felt to have a normocytic anemia likely due to acute disease.  CBC on admission included a hematocrit 34.4 and hemoglobin 11.7.  Hemoglobin at discharge included a hematocrit of 30.6 and a hemoglobin of 10.0.  CBC on 11/13/2018 included a hematocrit of 28.7, hemoglobin 9.4, MCV 93, platelets 538,000, and white count 10,800.   Initial blood gas revealed a pH of 7.19.  Creatinine on 09/28/2018 was 5.0.  Labs on 11/13/2018 included a sodium of 121, albumin 3.1, creatinine 0.6.  B12 was 743.   Folate was 6.7 on 09/13/2018.  TSH was 0.57 with a free T4 of 0.75 on 10/07/2018.  HIV testing was negative on 10/01/2018.  SPEP on 09/18/2018 revealed no monoclonal protein.  Vitamin B1 was 61.3 (66.5 -200) on 09/13/2018.   Regarding her diet, she is eating well.  She eats meat every day as well as a vegetable and starch.  She denies any melena, hematochezia or hematuria.  She has had no menses status post IUD and Depo-Provera.  Symptomatically, she lost 20 pounds but is gaining it back.  She denies any fevers or sweats.  She states she gets "pneumonia easily".  She has pneumonia at least once a year.  She denies any melena or hematochezia.  She notes that her bones are fragile.  She has a right tibial fracture in a cast after kicking a door in.  She has a stage IV sacral decubitus ulcer.  She is being followed by surgery, Dr. Windell Moment.   Past Medical History:  Diagnosis Date  . Bell palsy   . Charcot-Marie-Tooth disease   . GERD (gastroesophageal reflux disease)   . Hypertension   . Migraines     Past Surgical History:  Procedure Laterality Date  . APPENDECTOMY    . KNEE SURGERY    . KNEE SURGERY Right   . TONSILLECTOMY      Family History  Problem Relation Age of Onset  . Other Mother        unknown medical history  . Diabetes Father   .  Multiple sclerosis Father   . Cancer Father   . Heart disease Father     Social History:  reports that she has been smoking cigarettes. She has been smoking about 0.50 packs per day. She has never used smokeless tobacco. She reports previous alcohol use of about 14.0 standard drinks of alcohol per week. She reports that she does not use drugs.  She vapes.  She has smoked 1/2 - 1 pack/day since age 51.  She stopped smoking last year.  She drank a six pack of beer/day until 10/2017.  She lives in [place].  The patient is alone today.  Allergies:  Allergies  Allergen Reactions  . Acetaminophen-Codeine Other (See Comments)    Had chest pain  once. Has taken percocet, norco without problem  . Codeine   . Sulfa Antibiotics Hives  . Sulfur Hives  . Sumatriptan Other (See Comments)    Other Reaction: Not Assessed    Current Medications: Current Outpatient Medications  Medication Sig Dispense Refill  . calcitonin, salmon, (MIACALCIN/FORTICAL) 200 UNIT/ACT nasal spray Place 1 spray into alternate nostrils daily. 3.7 mL 0  . collagenase (SANTYL) ointment Apply 1 application topically daily.     Marland Kitchen doxycycline (VIBRA-TABS) 100 MG tablet Take 100 mg by mouth 2 (two) times daily.     . folic acid (FOLVITE) 1 MG tablet Take 1 tablet by mouth daily.    Marland Kitchen gabapentin (NEURONTIN) 600 MG tablet Take 1,200 mg by mouth 3 (three) times daily.    . Lactobacillus-Inulin ([place]) CAPS Take 1 tablet by mouth daily.    Marland Kitchen levonorgestrel (MIRENA) 20 MCG/24HR IUD 1 each by Intrauterine route once.    . medroxyPROGESTERone (DEPO-SUBQ PROVERA 104) 104 MG/0.65ML injection Inject 104 mg into the skin every 3 (three) months.    . Melatonin 3 MG TABS Take 2 capsules by mouth at bedtime.    . Multiple Vitamins-Minerals (MULTIVITAMIN ADULT PO) Take 1 tablet by mouth daily.    Marland Kitchen nystatin cream (MYCOSTATIN) Apply 1 application topically daily.     Marland Kitchen omeprazole (PRILOSEC) 20 MG capsule Take 20 mg by mouth daily.    . Oxycodone HCl 10 MG TABS Take 10 mg by mouth 3 (three) times daily as needed (pain).    . pantoprazole (PROTONIX) 40 MG tablet Take 1 tablet by mouth daily.    . potassium chloride SA (K-DUR,KLOR-CON) 20 MEQ tablet Take 1 tablet by mouth 2 (two) times daily.    Marland Kitchen SANTYL ointment Apply 1 application topically daily.     Marland Kitchen thiamine 100 MG tablet Take 1 tablet by mouth daily.    . traZODone (DESYREL) 50 MG tablet Take 1 tablet by mouth at bedtime.     No current facility-administered medications for this visit.     Review of Systems:  GENERAL:  Fatigue.  No fevers, sweats.  Gaining weight back. PERFORMANCE STATUS (ECOG):  2  HEENT:  No visual changes, runny nose, sore throat, mouth sores or tenderness. Lungs: Pneumonia, yearly.  No shortness of breath or cough.  No hemoptysis. Cardiac:  Chest pain off and on for years.  No palpitations, orthopnea, or PND. GI:  Nausea.  No vomiting, diarrhea, constipation, melena or hematochezia. GU:  No urgency, frequency, dysuria, or hematuria.  No menses s/p IUD and depo-Provera. Musculoskeletal:  Fragile bones.  s/p right tibial fracture.  No back pain.  Joint pain.  No muscle tenderness. Extremities:  No pain or swelling. Skin:  No rashes or skin changes. Neuro:  No headache, numbness or weakness, balance or coordination issues. Endocrine:  No diabetes, thyroid issues, hot flashes or night sweats. Psych:  No mood changes, depression or anxiety. Pain:  Chronic pain. Review of systems:  All other systems reviewed and found to be negative.  Physical Exam: Blood pressure 136/82, pulse 84, temperature (!) 97.5 F (36.4 C), temperature source Oral, resp. rate 16, height 5\' 9"  (1.753 m), weight 130 lb (59 kg). GENERAL:  Thin chronically fatigued appearing woman sitting comfortably in a wheelchair in the exam room in no acute distress. MENTAL STATUS:  Alert and oriented to person, place and time. HEAD:  Brown hair pulled back.  Normocephalic, atraumatic, face symmetric, no Cushingoid features. EYES:  Blue eyes.  Pupils equal round and reactive to light and accomodation.  No conjunctivitis or scleral icterus. ENT:  Oropharynx clear without lesion.  Tongue normal. Mucous membranes moist.  RESPIRATORY:  Clear to auscultation without rales, wheezes or rhonchi. CARDIOVASCULAR:  Regular rate and rhythm without murmur, rub or gallop. ABDOMEN:  Soft, non-tender, with active bowel sounds, and no hepatosplenomegaly.  No masses. SKIN:  5 cm stage IV sacral decubitus ulcer with visible bone.  No rashes. EXTREMITIES:  Right lower extremity cast.  No edema, no skin discoloration or tenderness.   No palpable cords. LYMPH NODES: No palpable cervical, supraclavicular, axillary or inguinal adenopathy  NEUROLOGICAL: Unremarkable. PSYCH:  Appropriate.   No visits with results within 3 Day(s) from this visit.  Latest known visit with results is:  No results found for any previous visit.    Assessment:  Lori Moon is a 43 y.o. 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  Labs dating back to 05/17/2017 reveal a nadir hemoglobin was 6.3 on 11/24/2017.  MCV has ranged from 87 - 101.  Platelet count has been between 71,000 - 603,000.  Differential has been unremarkable.    Iron studies have fluctuated with a ferritin of 920 and iron saturation of 98% on 11/22/2017 then a ferritin of 98 with an iron saturation of 8% on 03/15/2018.  Normal labs on 09/2017 included: hepatitis A, B and C testing, and HIV testing.  Labs on 11/13/2018 included a sodium of 121, albumin 3.1, creatinine 0.6.  B12 was 743.  Folate was 6.7 on 09/13/2018.  TSH was 0.57 with a free T4 of 0.75 on 10/07/2018.  HIV testing was negative on 10/01/2018.  SPEP on 09/18/2018 revealed no monoclonal protein.  Vitamin B1 was 61.3 (66.5 -200) on 09/13/2018.   Diet is good.  She is on folic acid, thiamine, and MVI.  She denies any melena, hematochezia, hematuria, or vaginal bleeding.  The patient was admitted to [facility] from 09/28/2018 - 10/12/2018 with influenza A well as multi-focal pneumonia pneumonia due to streptococcus.  She had sepsis, hyponatremia, and acute renal failure (Cr 5.0).  She was intubated between 02/03 - 10/06/2017.  She was also treated for alcohol withdrawal with a phenobarbital protocol.  She was felt to have a normocytic anemia likely due to acute disease.  Symptomatically, she lost 20 pounds but is gaining it back.  She denies any fevers or sweats.  She is in a right lower extremity cast s/p tibial fracture.  She has a 5 cm stage IV sacral decubitus ulcer.     Plan: 1.  Labs today:  CBC with diff, retic, haptoglobin, B1, B12, folate, ferritin, iron studies, sed rate, FLCA, myeloma panel. 2.  Normocytic anemia  Etiology felt secondary to  anemia of chronic disease.  Discuss laboratory work-up. 3.   Thrombocytosis  Etiology felt reactive.  Check iron studies and sed rate.  Doubt myeloproliferative disorder. 4.   RTC in 1 week for MD assessment, review of work-up, and discussion regarding direction of therapy.   Lequita Asal, MD, PhD  12/11/2018, 11:07 AM

## 2018-12-11 NOTE — Progress Notes (Signed)
Pt here as new patient referral from Dr. Lethea Killings d/t Anemia. Denies any concerns.

## 2018-12-12 LAB — MULTIPLE MYELOMA PANEL, SERUM
Albumin SerPl Elph-Mcnc: 3.4 g/dL (ref 2.9–4.4)
Albumin/Glob SerPl: 1.2 (ref 0.7–1.7)
Alpha 1: 0.4 g/dL (ref 0.0–0.4)
Alpha2 Glob SerPl Elph-Mcnc: 0.9 g/dL (ref 0.4–1.0)
B-Globulin SerPl Elph-Mcnc: 0.7 g/dL (ref 0.7–1.3)
Gamma Glob SerPl Elph-Mcnc: 0.9 g/dL (ref 0.4–1.8)
Globulin, Total: 3 g/dL (ref 2.2–3.9)
IgA: 332 mg/dL (ref 87–352)
IgG (Immunoglobin G), Serum: 1170 mg/dL (ref 586–1602)
IgM (Immunoglobulin M), Srm: 59 mg/dL (ref 26–217)
Total Protein ELP: 6.4 g/dL (ref 6.0–8.5)

## 2018-12-12 LAB — KAPPA/LAMBDA LIGHT CHAINS
Kappa free light chain: 27.6 mg/L — ABNORMAL HIGH (ref 3.3–19.4)
Kappa, lambda light chain ratio: 0.57 (ref 0.26–1.65)
Lambda free light chains: 48.2 mg/L — ABNORMAL HIGH (ref 5.7–26.3)

## 2018-12-12 LAB — HAPTOGLOBIN: Haptoglobin: 296 mg/dL — ABNORMAL HIGH (ref 33–278)

## 2018-12-13 LAB — VITAMIN B1: Vitamin B1 (Thiamine): 138.3 nmol/L (ref 66.5–200.0)

## 2018-12-15 DIAGNOSIS — D649 Anemia, unspecified: Secondary | ICD-10-CM | POA: Insufficient documentation

## 2018-12-15 DIAGNOSIS — D473 Essential (hemorrhagic) thrombocythemia: Secondary | ICD-10-CM | POA: Insufficient documentation

## 2018-12-15 DIAGNOSIS — D75839 Thrombocytosis, unspecified: Secondary | ICD-10-CM | POA: Insufficient documentation

## 2018-12-17 NOTE — Progress Notes (Signed)
[facility]  [address] [place], [place] [address] Phone: [phone]  Fax: [phone]   Telemedicine Office Visit:  12/18/2018  Referring physician: [facility]*  I connected with Lori Moon on 12/18/18 at 2:57 PM EDT by videoconferencing and verified that I was speaking with the correct person using 2 identifiers.  The patient was at home.  I discussed the limitations, risk, security and privacy concerns of performing an evaluation and management service by videoconferencing and the availability of in person appointments.  I also discussed with the patient that there may be a patient responsible charge related to this service.  The patient expressed understanding and agreed to proceed.    Chief Complaint: Lori Moon is a 43 y.o. female with a normocytic anemia who is seen for review of work-up and discussion regarding direction of therapy.    HPI:  The patient was last seen in the hematology clinic on 12/11/2018 for initial consultation.  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  Symptomatically, she had lost 20 pounds but was gaining it back.  She denied any fevers or sweats.  She was in a right lower extremity cast s/p tibial fracture.  She had a 5 cm stage IV sacral decubitus ulcer.   She underwent a work-up.  CBC revealed a hematocrit of 31.8, hemoglobin 10.5, MCV 90.3, platelets 561,000, white count 12,600 with an ANC of 10,300.  Differential included 81% segs, 12% lymphs, 6% monocytes and 1% eosinophils.  Reticulocyte count was 1.6%.  Ferritin was 73.  Iron studies included an iron saturation of 10% and a TIBC of 252.  Sed rate was 31.  Folate was 55.2.  B1 was 138.3 (normal).  Haptoglobin was 296.  SPEP revealed no monoclonal protein.  IgG was 1170.  Kappa free light chains were 27.6, lambda free light chains 48.2 with a ratio of 0.57 (normal).  Urinalysis revealed no red blood cells.  Symptomatically, she denies any new complaints.  She states that she is taking a multi-vitamin a day.  She is working on her nutrition.  She denies any melena, hematochezia, or hematuria.   Past Medical History:  Diagnosis Date  . Bell palsy   . Charcot-Marie-Tooth disease   . GERD (gastroesophageal reflux disease)   . Hypertension   . Migraines     Past Surgical History:  Procedure Laterality Date  . APPENDECTOMY    . KNEE SURGERY    . KNEE SURGERY Right   . TONSILLECTOMY      Family History  Problem Relation Age of Onset  . Other Mother        unknown medical history  . Diabetes Father   . Multiple sclerosis Father   . Cancer Father   . Heart disease Father     Social History:  reports that she has been smoking cigarettes. She has been smoking about 0.50 packs per day. She has never used smokeless tobacco. She reports previous alcohol use of about 14.0 standard drinks of alcohol per week. She reports that she does not use drugs.  She vapes.  She has smoked 1/2 - 1 pack/day since age 50.  She stopped smoking last year.  She drank a six pack of beer/day until 10/2017.  She lives in [place].  The patient is alone today.  Participants in the patient's visit and their role in the encounter included the patient, and Vito Berger, CMA, today.  The intake visit was provided by Vito Berger, CMA.    Allergies:  Allergies  Allergen Reactions  . Acetaminophen-Codeine Other (See Comments)    Had chest pain once. Has taken percocet, norco without problem  . Codeine   . Sulfa Antibiotics Hives  . Sulfur Hives  . Sumatriptan Other (See Comments)    Other Reaction: Not Assessed    Current Medications: Current Outpatient Medications  Medication Sig Dispense Refill  . doxycycline (VIBRA-TABS) 100 MG tablet Take 100 mg by mouth 2 (two) times daily.     . folic acid (FOLVITE) 1 MG tablet Take 1 tablet by mouth daily.    Marland Kitchen gabapentin (NEURONTIN) 600 MG tablet Take 1,200 mg  by mouth 3 (three) times daily.    . Lactobacillus-Inulin ([place]) CAPS Take 1 tablet by mouth daily.    Marland Kitchen levonorgestrel (MIRENA) 20 MCG/24HR IUD 1 each by Intrauterine route once.    . medroxyPROGESTERone (DEPO-SUBQ PROVERA 104) 104 MG/0.65ML injection Inject 104 mg into the skin every 3 (three) months.    . Melatonin 3 MG TABS Take 2 capsules by mouth at bedtime.    . Multiple Vitamins-Minerals (MULTIVITAMIN ADULT PO) Take 1 tablet by mouth daily.    Marland Kitchen omeprazole (PRILOSEC) 20 MG capsule Take 20 mg by mouth daily.    . Oxycodone HCl 10 MG TABS Take 10 mg by mouth 3 (three) times daily as needed (pain).    . pantoprazole (PROTONIX) 40 MG tablet Take 1 tablet by mouth daily.    . potassium chloride SA (K-DUR,KLOR-CON) 20 MEQ tablet Take 1 tablet by mouth 2 (two) times daily.    Marland Kitchen SANTYL ointment Apply 1 application topically daily.     Marland Kitchen thiamine 100 MG tablet Take 1 tablet by mouth daily.    . traZODone (DESYREL) 50 MG tablet Take 1 tablet by mouth at bedtime.    . calcitonin, salmon, (MIACALCIN/FORTICAL) 200 UNIT/ACT nasal spray Place 1 spray into alternate nostrils daily. 3.7 mL 0  . nystatin cream (MYCOSTATIN) Apply 1 application topically daily.      No current facility-administered medications for this visit.     Review of Systems  Constitutional: Positive for malaise/fatigue. Negative for chills, diaphoresis, fever and weight loss.       Chronic fatigue.  HENT: Negative.  Negative for congestion, ear discharge, ear pain, nosebleeds, sinus pain and sore throat.   Eyes: Negative.  Negative for blurred vision and double vision.  Respiratory: Negative.  Negative for cough, hemoptysis, sputum production and shortness of breath.   Cardiovascular: Negative for palpitations, orthopnea, claudication, leg swelling and PND.       Intermittent chest pain.  Gastrointestinal: Negative for abdominal pain, blood in stool, constipation, diarrhea, melena and vomiting.        Patient on PPI.  Genitourinary: Negative.  Negative for dysuria, flank pain, frequency, hematuria and urgency.       No menses s/p IUD and depo-Provera.  Musculoskeletal: Positive for joint pain.       Fragile bones.  s/p right tibial fracture.  Skin: Negative for itching and rash.       Decubitus ulcer.  Neurological: Positive for weakness (general). Negative for sensory change, speech change, focal weakness and headaches.  Endo/Heme/Allergies: Negative.  Does not bruise/bleed easily.  Psychiatric/Behavioral: Negative for memory loss. The patient is not nervous/anxious and does not have insomnia.     Physical Exam  Constitutional: She is oriented to person, place, and time. No distress.  Thin  chronically fatigued appearing woman sitting comfortably at home in no acute distress.  HENT:  Head: Normocephalic and atraumatic.  Shoulder length brown hair.  Eyes: Conjunctivae and EOM are normal. No scleral icterus.  Blue eyes.  Neurological: She is alert and oriented to person, place, and time.  Skin: She is not diaphoretic.  Psychiatric: Mood, memory, affect and judgment normal.     No visits with results within 3 Day(s) from this visit.  Latest known visit with results is:  Appointment on 12/11/2018  Component Date Value Ref Range Status  . Haptoglobin 12/11/2018 296* 33 - 278 mg/dL Final   Comment: (NOTE) Performed At: [facility] [place], [place] [id number] Rush Farmer MD [id number]   . Folate 12/11/2018 55.2  >5.9 ng/mL Final   Comment: RESULT CONFIRMED BY MANUAL DILUTION Performed at [facility], [address] [place], [place] [address]   . Vitamin B1 (Thiamine) 12/11/2018 138.3  66.5 - 200.0 nmol/L Final   Comment: (NOTE) This test was developed and its performance characteristics determined by [facility]. It has not been cleared or approved by the Food and Drug Administration. Performed At: [facility] [place], [place] [id number] Rush Farmer MD [id number]   . Sed Rate 12/11/2018 31* 0 - 22 mm/hr Final   Performed at [facility], [address] [place], [place] [address]  . Iron 12/11/2018 26* 28 - 170 ug/dL Final  . TIBC 12/11/2018 252  250 - 450 ug/dL Final  . Saturation Ratios 12/11/2018 10* 10.4 - 31.8 % Final  . UIBC 12/11/2018 226  ug/dL Final   Performed at [facility], [address] [place], [place] [address]  . Ferritin 12/11/2018 73  11 - 307 ng/mL Final   Performed at [facility], [address] [place], [place] [address]  . Retic Ct Pct 12/11/2018 1.6  0.4 - 3.1 % Final  . RBC. 12/11/2018 3.52* 3.87 - 5.11 MIL/uL Final  . Retic Count, Absolute 12/11/2018 54.9  19.0 - 186.0 K/uL Final  . Immature Retic Fract 12/11/2018 8.0  2.3 - 15.9 % Final   Performed at [facility], [address] [place], [place] [address]  . Kappa free light chain 12/11/2018 27.6* 3.3 - 19.4 mg/L Final  . Lamda free light chains 12/11/2018 48.2* 5.7 - 26.3 mg/L Final  . Kappa, lamda light chain ratio 12/11/2018 0.57  0.26 - 1.65 Final   Comment: (NOTE) Performed At: [facility] [place], [place] [id number] Rush Farmer MD [id number]   . IgG (Immunoglobin G), Serum 12/11/2018 1,170  586 - 1,602 mg/dL Final                 **Please note reference interval change**  . IgA 12/11/2018 332  87 - 352 mg/dL Final  . IgM (Immunoglobulin M), Srm 12/11/2018 59  26 - 217 mg/dL Final  . Total Protein ELP 12/11/2018 6.4  6.0 - 8.5 g/dL Corrected  . Albumin SerPl Elph-Mcnc 12/11/2018 3.4  2.9 - 4.4 g/dL Corrected  . Alpha 1 12/11/2018 0.4  0.0 - 0.4 g/dL Corrected  . Alpha2 Glob SerPl Elph-Mcnc 12/11/2018 0.9  0.4 - 1.0 g/dL Corrected  . B-Globulin SerPl Elph-Mcnc 12/11/2018 0.7  0.7 - 1.3 g/dL Corrected  . Gamma Glob SerPl Elph-Mcnc 12/11/2018 0.9  0.4 - 1.8 g/dL Corrected  . M Protein SerPl Elph-Mcnc 12/11/2018 Not Observed  Not  Observed g/dL Corrected  . Globulin, Total 12/11/2018 3.0  2.2 - 3.9 g/dL Corrected  . Albumin/Glob SerPl 12/11/2018 1.2  0.7 - 1.7 Corrected  . IFE 1 12/11/2018 Comment   Corrected   Comment: (NOTE) The immunofixation pattern appears unremarkable. Evidence of monoclonal protein is not apparent.   . Please Note 12/11/2018 Comment   Corrected   Comment: (NOTE) Protein electrophoresis scan will follow via computer, mail, or courier delivery. Performed At: [facility] [place], [place] [id number] Rush Farmer MD [id number]   . WBC 12/11/2018 12.6* 4.0 - 10.5 K/uL Final  . RBC 12/11/2018 3.52* 3.87 - 5.11 MIL/uL Final  . Hemoglobin 12/11/2018 10.5* 12.0 - 15.0 g/dL Final  . HCT 12/11/2018 31.8* 36.0 - 46.0 % Final  . MCV 12/11/2018 90.3  80.0 - 100.0 fL Final  . MCH 12/11/2018 29.8  26.0 - 34.0 pg Final  . MCHC 12/11/2018 33.0  30.0 - 36.0 g/dL Final  . RDW 12/11/2018 13.3  11.5 - 15.5 % Final  . Platelets 12/11/2018 561* 150 - 400 K/uL Final  . nRBC 12/11/2018 0.0  0.0 - 0.2 % Final  . Neutrophils Relative % 12/11/2018 81  % Final  . Neutro Abs 12/11/2018 10.3* 1.7 - 7.7 K/uL Final  . Lymphocytes Relative 12/11/2018 12  % Final  . Lymphs Abs 12/11/2018 1.5  0.7 - 4.0 K/uL Final  . Monocytes Relative 12/11/2018 6  % Final  . Monocytes Absolute 12/11/2018 0.7  0.1 - 1.0 K/uL Final  . Eosinophils Relative 12/11/2018 1  % Final  . Eosinophils Absolute 12/11/2018 0.1  0.0 - 0.5 K/uL Final  . Basophils Relative 12/11/2018 0  % Final  . Basophils Absolute 12/11/2018 0.1  0.0 - 0.1 K/uL Final  . Immature Granulocytes 12/11/2018 0  % Final  . Abs Immature Granulocytes 12/11/2018 0.05  0.00 - 0.07 K/uL Final   Performed at [facility], [address], [place], [place] [address]  . Color, Urine 12/11/2018 YELLOW  YELLOW Final  . APPearance 12/11/2018 HAZY* CLEAR Final  . Specific Gravity, Urine 12/11/2018 <1.005* 1.005 - 1.030 Final  . pH  12/11/2018 5.5  5.0 - 8.0 Final  . Glucose, UA 12/11/2018 NEGATIVE  NEGATIVE mg/dL Final  . Hgb urine dipstick 12/11/2018 NEGATIVE  NEGATIVE Final  . Bilirubin Urine 12/11/2018 NEGATIVE  NEGATIVE Final  . Ketones, ur 12/11/2018 NEGATIVE  NEGATIVE mg/dL Final  . Protein, ur 12/11/2018 NEGATIVE  NEGATIVE mg/dL Final  . Nitrite 12/11/2018 NEGATIVE  NEGATIVE Final  . Leukocytes,Ua 12/11/2018 TRACE* NEGATIVE Final  . Squamous Epithelial / LPF 12/11/2018 21-50  0 - 5 Final  . WBC, UA 12/11/2018 0-5  0 - 5 WBC/hpf Final  . RBC / HPF 12/11/2018 NONE SEEN  0 - 5 RBC/hpf Final  . Bacteria, UA 12/11/2018 RARE* NONE SEEN Final   Performed at [facility], [address], [place], [place] [address]    Assessment:  BREXLEY CUTSHAW is a 43 y.o. 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  Labs dating back to 05/17/2017 reveal a nadir hemoglobin was 6.3 on 11/24/2017.  MCV has ranged from 87 - 101.  Platelet count has been between 71,000 - 603,000.  Differential has been unremarkable.    Iron studies have fluctuated with a ferritin of 920 and iron saturation of 98% on 11/22/2017 then a ferritin of 98 with an iron saturation of 8% on 03/15/2018.  Normal labs on 09/2017 included: hepatitis A, B and C testing,  and HIV testing.  Labs on 11/13/2018 included a sodium of 121, albumin 3.1, creatinine 0.6.  B12 was 743.  Folate was 6.7 on 09/13/2018.  TSH was 0.57 with a free T4 of 0.75 on 10/07/2018.  HIV testing was negative on 10/01/2018.  SPEP on 09/18/2018 revealed no monoclonal protein.  Vitamin B1 was 61.3 (66.5 -200) on 09/13/2018.   Work-up on 12/11/2018 revealed a hematocrit of 31.8, hemoglobin 10.5, MCV 90.3, platelets 561,000, white count 12,600 with an ANC of 10,300.  Differential was unremakable.  Reticulocyte count was 1.6%.  Ferritin was 73.  Iron studies included an iron saturation of 10% (low) and a TIBC of 252.  Sed rate was 31  (0-22).  Normal studies included: folate, B1, haptoglobin, SPEP, IgG, free light chain ratio, and urinalysis (no RBCs).  Diet is good.  She is on folic acid, thiamine, and MVI.  She denies any melena, hematochezia, hematuria, or vaginal bleeding.  The patient was admitted to [facility] from 09/28/2018 - 10/12/2018 with influenza A well as multi-focal pneumonia pneumonia due to streptococcus.  She had sepsis, hyponatremia, and acute renal failure (Cr 5.0).  She was intubated between 02/03 - 10/06/2017.  She was also treated for alcohol withdrawal with a phenobarbital protocol.  She was felt to have a normocytic anemia likely due to acute disease.  Symptomatically, she denies any new complaints.  Plan: 1.   Review work-up. 2.   Normocytic anemia  Hemoglobin 10.5.  MCV 90.3.  Ferritin 73 with an iron saturation of 10% (low).  Sed rate 31.  Ferritin may be falsely elevated secondary to sed rate.  Etiology felt secondary to anemia of chronic disease and possibly some component of iron deficiency.  Discuss trial of oral iron. 3.   Thrombocytosis  Platelet count 561,000.  Etiology felt reactive (elevated sed rate and iron deficiency)  Postpone evaluation for a myeloproliferative disorder. 4.   RTC in 1 month for MD assessment, labs (CBC with diff, ferritin, iron studies).   I discussed the assessment and treatment plan with the patient.  The patient was provided an opportunity to ask questions and all were answered.  The patient agreed with the plan and demonstrated an understanding of the instructions.  The patient was advised to call back or seek an in person evaluation if the symptoms worsen or if the condition fails to improve as anticipated.  I provided 15 minutes (2:57 PM - 3:11 PM) of face-to-face video visit time during this this encounter and > 50% was spent counseling as documented under my assessment and plan.  I provided these services from the [facility] office.   Lequita Asal, MD, PhD   12/18/2018, 2:57 PM

## 2018-12-18 ENCOUNTER — Inpatient Hospital Stay (HOSPITAL_BASED_OUTPATIENT_CLINIC_OR_DEPARTMENT_OTHER): Payer: Medicare Other | Admitting: Hematology and Oncology

## 2018-12-18 ENCOUNTER — Encounter: Payer: Self-pay | Admitting: Hematology and Oncology

## 2018-12-18 DIAGNOSIS — D473 Essential (hemorrhagic) thrombocythemia: Secondary | ICD-10-CM

## 2018-12-18 DIAGNOSIS — D649 Anemia, unspecified: Secondary | ICD-10-CM

## 2018-12-18 DIAGNOSIS — D75839 Thrombocytosis, unspecified: Secondary | ICD-10-CM

## 2018-12-18 NOTE — Progress Notes (Signed)
No new changes noted today. The patient DOB, Name and address has been verified by phone. Cbg, CMA

## 2019-01-28 ENCOUNTER — Other Ambulatory Visit: Payer: Self-pay

## 2019-01-29 ENCOUNTER — Telehealth: Payer: Self-pay

## 2019-01-29 ENCOUNTER — Inpatient Hospital Stay: Payer: Medicare Other | Attending: Hematology and Oncology

## 2019-01-29 DIAGNOSIS — D75839 Thrombocytosis, unspecified: Secondary | ICD-10-CM

## 2019-01-29 DIAGNOSIS — D638 Anemia in other chronic diseases classified elsewhere: Secondary | ICD-10-CM | POA: Diagnosis present

## 2019-01-29 DIAGNOSIS — D473 Essential (hemorrhagic) thrombocythemia: Secondary | ICD-10-CM | POA: Diagnosis not present

## 2019-01-29 DIAGNOSIS — D649 Anemia, unspecified: Secondary | ICD-10-CM

## 2019-01-29 LAB — CBC WITH DIFFERENTIAL/PLATELET
Abs Immature Granulocytes: 0.05 10*3/uL (ref 0.00–0.07)
Basophils Absolute: 0 10*3/uL (ref 0.0–0.1)
Basophils Relative: 0 %
Eosinophils Absolute: 0.2 10*3/uL (ref 0.0–0.5)
Eosinophils Relative: 1 %
HCT: 36.2 % (ref 36.0–46.0)
Hemoglobin: 11.7 g/dL — ABNORMAL LOW (ref 12.0–15.0)
Immature Granulocytes: 0 %
Lymphocytes Relative: 15 %
Lymphs Abs: 2.1 10*3/uL (ref 0.7–4.0)
MCH: 29.5 pg (ref 26.0–34.0)
MCHC: 32.3 g/dL (ref 30.0–36.0)
MCV: 91.4 fL (ref 80.0–100.0)
Monocytes Absolute: 1 10*3/uL (ref 0.1–1.0)
Monocytes Relative: 7 %
Neutro Abs: 10.8 10*3/uL — ABNORMAL HIGH (ref 1.7–7.7)
Neutrophils Relative %: 77 %
Platelets: 449 10*3/uL — ABNORMAL HIGH (ref 150–400)
RBC: 3.96 MIL/uL (ref 3.87–5.11)
RDW: 14.7 % (ref 11.5–15.5)
WBC: 14.3 10*3/uL — ABNORMAL HIGH (ref 4.0–10.5)
nRBC: 0 % (ref 0.0–0.2)

## 2019-01-29 LAB — IRON AND TIBC
Iron: 13 ug/dL — ABNORMAL LOW (ref 28–170)
Saturation Ratios: 6 % — ABNORMAL LOW (ref 10.4–31.8)
TIBC: 223 ug/dL — ABNORMAL LOW (ref 250–450)
UIBC: 210 ug/dL

## 2019-01-29 LAB — FERRITIN: Ferritin: 41 ng/mL (ref 11–307)

## 2019-01-29 NOTE — Telephone Encounter (Signed)
-----   Message from Lequita Asal, MD sent at 01/29/2019  4:07 PM EDT ----- Regarding: Please call patient  Anemia improving.  Continue oral iron.  M ----- Message ----- From: Buel Ream, [facility] [place] Sent: 01/29/2019  11:12 AM EDT To: Lequita Asal, MD

## 2019-01-29 NOTE — Telephone Encounter (Signed)
Left a message on the patient voice mail To inform the patient of labs results.Anemia is improving and  continue oral iron i have also instructed the patient to call the office at [phone] if she have any question or concerns.

## 2019-03-10 NOTE — Progress Notes (Signed)
[facility]  [address] [place], [place] [address] Phone: [phone]  Fax: [phone]   Clinic Day:  03/12/2019  Referring physician: Langley [facility]  Chief Complaint: Lori Moon is a 43 y.o. female with normocytic anemia who is seen for 3 month assessment.    HPI: The patient was last seen in the hematology clinic on 12/18/2018. At that time, she denied any new complaints.  She was seen in surgery by Dr. Windell Moment twice for an ulcer of the sacral region. 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   She was seen in neurology via telemedicine on 01/17/2019 by Dr. Caryn Section for lower extremity weakness and numbness. She was referred to pain management for a second opinion and consideration of non-opioid pain management as she was already on the maximum dose of gabapentin and suboxone.  MRI is scheduled with follow-up in August.  Labs on 01/29/2019: WBC 14,300, hemoglobin 11.7, hematocrit 36.2, platelets 449,000.  Ferritin was 41 with an iron saturation of 6% and a TIBC 223.   During the interim, she is doing well. She denies any fevers. She reports numbness and weakness in her legs and arms. She had shoulder pain when lifting objects and cannot move her arms up above her shoulders.   She will follow-up with Dr. Windell Moment on Monday, 03/17/2019.   Past Medical History:  Diagnosis Date  . Bell palsy   . Charcot-Marie-Tooth disease   . GERD (gastroesophageal reflux disease)   . Hypertension   . Migraines     Past Surgical History:  Procedure Laterality Date  . APPENDECTOMY    . KNEE SURGERY    . KNEE SURGERY Right   . TONSILLECTOMY      Family History  Problem Relation Age of Onset  . Other Mother        unknown medical history  . Diabetes Father   . Multiple sclerosis Father   . Cancer Father   . Heart disease Father     Social History:  reports that she has been smoking cigarettes.  She has been smoking about 0.50 packs per day. She has never used smokeless tobacco. She reports previous alcohol use of about 14.0 standard drinks of alcohol per week. She reports that she does not use drugs. She vapes. She has smoked 1/2 - 1 pack/day since age 76.  She stopped smoking last year. She drank a six pack of beer/day until 10/2017. She lives in [place]. The patient is alone today.  Allergies:  Allergies  Allergen Reactions  . Acetaminophen-Codeine Other (See Comments)    Had chest pain once. Has taken percocet, norco without problem  . Codeine   . Sulfa Antibiotics Hives  . Sulfur Hives  . Sumatriptan Other (See Comments)    Other Reaction: Not Assessed    Current Medications: Current Outpatient Medications  Medication Sig Dispense Refill  . albuterol (VENTOLIN HFA) 108 (90 Base) MCG/ACT inhaler INL 2 INHALATIONS ITL QID PRF WHZ OR SOB    . calcitonin, salmon, (MIACALCIN/FORTICAL) 200 UNIT/ACT nasal spray Place 1 spray into alternate nostrils daily. (Patient taking differently: Place 1 spray into alternate nostrils as needed. ) 3.7 mL 0  . clindamycin (CLEOCIN) 300 MG capsule Take 300 mg by mouth 3 (three) times daily.     Marland Kitchen doxycycline (VIBRA-TABS) 100 MG tablet Take 100 mg by mouth 2 (two) times daily.     . folic acid (FOLVITE) 1 MG tablet Take  1 tablet by mouth daily.    Marland Kitchen gabapentin (NEURONTIN) 600 MG tablet Take 1,200 mg by mouth 3 (three) times daily.    . Lactobacillus-Inulin (Motley) CAPS Take 1 tablet by mouth daily.    Marland Kitchen levonorgestrel (MIRENA) 20 MCG/24HR IUD 1 each by Intrauterine route once.    . medroxyPROGESTERone (DEPO-SUBQ PROVERA 104) 104 MG/0.65ML injection Inject 104 mg into the skin every 3 (three) months.    . Melatonin 3 MG TABS Take 2 capsules by mouth at bedtime.    . Multiple Vitamins-Minerals (MULTIVITAMIN ADULT PO) Take 1 tablet by mouth daily.    Marland Kitchen omeprazole (PRILOSEC) 20 MG capsule Take 20 mg by mouth daily.    . Oxycodone  HCl 10 MG TABS Take 10 mg by mouth 3 (three) times daily as needed (pain).    Marland Kitchen oxyCODONE-acetaminophen (PERCOCET) 7.5-325 MG tablet TK 1 T PO Q 8 H PRN P    . pantoprazole (PROTONIX) 40 MG tablet Take 1 tablet by mouth daily.    . traZODone (DESYREL) 50 MG tablet Take 1 tablet by mouth at bedtime.    Marland Kitchen SANTYL ointment Apply 1 application topically daily.      No current facility-administered medications for this visit.     Review of Systems  Constitutional: Positive for malaise/fatigue (chronic) and weight loss (1lb). Negative for chills, diaphoresis and fever.  HENT: Negative.  Negative for congestion, hearing loss, nosebleeds, sinus pain and sore throat.   Eyes: Negative.  Negative for blurred vision and double vision.  Respiratory: Negative.  Negative for cough, hemoptysis, sputum production and shortness of breath.   Cardiovascular: Negative for palpitations, orthopnea, claudication, leg swelling and PND.       Intermittent chest pain.  Gastrointestinal: Positive for heartburn (on PPI). Negative for abdominal pain, blood in stool, constipation, diarrhea, melena, nausea and vomiting.  Genitourinary: Negative.  Negative for dysuria, flank pain, frequency, hematuria and urgency.       No menses s/p IUD and depo-Provera.  Musculoskeletal: Positive for joint pain (shoulders). Negative for back pain and myalgias.       Fragile bones.  s/p right tibial fracture.  Skin: Negative for itching and rash.       Decubitus ulcer.  Neurological: Positive for sensory change (numbness in arms and legs) and weakness (arms and legs). Negative for dizziness, speech change, focal weakness and headaches.  Endo/Heme/Allergies: Negative.  Does not bruise/bleed easily.  Psychiatric/Behavioral: Negative.  Negative for depression, memory loss and substance abuse. The patient is not nervous/anxious and does not have insomnia.   All other systems reviewed and are negative.  Performance status (ECOG): 2  Vitals  Blood pressure (!) 136/95, pulse 90, temperature 98.4 F (36.9 C), temperature source Tympanic, resp. rate 16, weight 129 lb 11.9 oz (58.8 kg), SpO2 95 %.   Physical Exam  Constitutional: She is oriented to person, place, and time. She appears well-developed and well-nourished. No distress.  Thin chronically fatigued appearing woman sitting comfortably in no acute distress.   HENT:  Head: Normocephalic and atraumatic.  Mouth/Throat: Oropharynx is clear and moist. No oropharyngeal exudate.  Shoulder length brown hair. Mask.  Eyes: Pupils are equal, round, and reactive to light. Conjunctivae and EOM are normal. No scleral icterus.  Blue eyes.   Neck: Normal range of motion. Neck supple.  Cardiovascular: Normal rate, regular rhythm and normal heart sounds.  No murmur heard. Pulmonary/Chest: Effort normal and breath sounds normal. No respiratory distress. She has no wheezes.  Abdominal:  Soft. Bowel sounds are normal. She exhibits no distension. There is no abdominal tenderness.  Musculoskeletal: Normal range of motion.        General: No edema.  Lymphadenopathy:    She has no cervical adenopathy.    She has no axillary adenopathy.       Right: No supraclavicular adenopathy present.       Left: No supraclavicular adenopathy present.  Neurological: She is alert and oriented to person, place, and time.  Skin: Skin is warm and dry. She is not diaphoretic. No erythema.  Psychiatric: She has a normal mood and affect. Her behavior is normal. Judgment and thought content normal.  Nursing note and vitals reviewed.   Appointment on 03/12/2019  Component Date Value Ref Range Status  . WBC 03/12/2019 9.5  4.0 - 10.5 K/uL Final  . RBC 03/12/2019 4.08  3.87 - 5.11 MIL/uL Final  . Hemoglobin 03/12/2019 12.5  12.0 - 15.0 g/dL Final  . HCT 03/12/2019 38.0  36.0 - 46.0 % Final  . MCV 03/12/2019 93.1  80.0 - 100.0 fL Final  . MCH 03/12/2019 30.6  26.0 - 34.0 pg Final  . MCHC 03/12/2019 32.9  30.0 -  36.0 g/dL Final  . RDW 03/12/2019 13.9  11.5 - 15.5 % Final  . Platelets 03/12/2019 371  150 - 400 K/uL Final  . nRBC 03/12/2019 0.0  0.0 - 0.2 % Final  . Neutrophils Relative % 03/12/2019 73  % Final  . Neutro Abs 03/12/2019 6.9  1.7 - 7.7 K/uL Final  . Lymphocytes Relative 03/12/2019 18  % Final  . Lymphs Abs 03/12/2019 1.7  0.7 - 4.0 K/uL Final  . Monocytes Relative 03/12/2019 6  % Final  . Monocytes Absolute 03/12/2019 0.6  0.1 - 1.0 K/uL Final  . Eosinophils Relative 03/12/2019 3  % Final  . Eosinophils Absolute 03/12/2019 0.3  0.0 - 0.5 K/uL Final  . Basophils Relative 03/12/2019 0  % Final  . Basophils Absolute 03/12/2019 0.0  0.0 - 0.1 K/uL Final  . Immature Granulocytes 03/12/2019 0  % Final  . Abs Immature Granulocytes 03/12/2019 0.03  0.00 - 0.07 K/uL Final   Performed at [facility], [address], [place], [place] [address]    Assessment:  CIMBERLY STOFFEL is a 43 y.o. 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  Labs dating back to 05/17/2017 reveal a nadir hemoglobin was 6.3 on 11/24/2017.  MCV has ranged from 87 - 101.  Platelet count has been between 71,000 - 603,000.  Differential has been unremarkable.    Iron studies have fluctuated with a ferritin of 920 and iron saturation of 98% on 11/22/2017 then a ferritin of 98 with an iron saturation of 8% on 03/15/2018.  Normal labs on 09/2017 included: hepatitis A, B and C testing, and HIV testing.  Labs on 11/13/2018 included a sodium of 121, albumin 3.1, creatinine 0.6.  B12 was 743.  Folate was 6.7 on 09/13/2018.  TSH was 0.57 with a free T4 of 0.75 on 10/07/2018.  HIV testing was negative on 10/01/2018.  SPEP on 09/18/2018 revealed no monoclonal protein.  Vitamin B1 was 61.3 (66.5 -200) on 09/13/2018.   Work-up on 12/11/2018 revealed a hematocrit of 31.8, hemoglobin 10.5, MCV 90.3, platelets 561,000, white count 12,600 with an ANC of 10,300.  Differential  was unremakable.  Reticulocyte  161,000 on 12/11/2018.  1.6%.  Ferritin was 73.  Iron studies included an iron  saturation of 10% (low) and a TIBC of 252.  Sed rate was 31 (0-22).  Normal studies included: folate, B1, haptoglobin, SPEP, IgG, free light chain ratio, and urinalysis (no RBCs).  Diet is good.  She is on folic acid, thiamine, and MVI.  She denies any melena, hematochezia, hematuria, or vaginal bleeding.  Ferritin has been followed: 73 on 12/11/2018 and 41 on 01/29/2019.  The patient was admitted to [facility] from 09/28/2018 - 10/12/2018 with influenza A well as multi-focal pneumonia pneumonia due to streptococcus.  She had sepsis, hyponatremia, and acute renal failure (Cr 5.0).  She was intubated between 02/03 - 10/06/2017.  She was also treated for alcohol withdrawal with a phenobarbital protocol.  She was felt to have a normocytic anemia likely due to acute disease.  Symptomatically, she has a slowly heeling sacral ulcer.  She denies any bleeding.  Plan: 1.   Labs today:  CBC with diff, ferritin, iron studies, sed rate. 2.   Normocytic anemia             Hemoglobin 12.5.  MCV 93.1.             Ferritin 52 with an iron saturation of 18% and a TIBC of 211.             Ferritin may be falsely elevated secondary to sed rate.             Patient has anemia of chronic disease with possibly some minor component of iron deficiency. 3.   Thrombocytosis             Platelet count 371,000 today.             Platelet count was 561,000 on 12/11/2018.  Reactive thrombocytosis has resolved. 4.   RTC prn.  I discussed the assessment and treatment plan with the patient.  The patient was provided an opportunity to ask questions and all were answered.  The patient agreed with the plan and demonstrated an understanding of the instructions.  The patient was advised to call back if the symptoms worsen or if the condition fails to improve as anticipated.    Lequita Asal, MD, PhD    03/12/2019, 11:51  AM  I, Molly Dorshimer, am acting as Education administrator for [employer]. Mike Gip, MD, PhD.  I,  C. Mike Gip, MD, have reviewed the above documentation for accuracy and completeness, and I agree with the above.

## 2019-03-11 ENCOUNTER — Other Ambulatory Visit: Payer: Self-pay

## 2019-03-12 ENCOUNTER — Inpatient Hospital Stay: Payer: Medicare Other | Attending: Hematology and Oncology

## 2019-03-12 ENCOUNTER — Ambulatory Visit: Payer: Medicare Other | Admitting: Hematology and Oncology

## 2019-03-12 ENCOUNTER — Inpatient Hospital Stay (HOSPITAL_BASED_OUTPATIENT_CLINIC_OR_DEPARTMENT_OTHER): Payer: Medicare Other | Admitting: Hematology and Oncology

## 2019-03-12 ENCOUNTER — Other Ambulatory Visit: Payer: Self-pay | Admitting: Hematology and Oncology

## 2019-03-12 ENCOUNTER — Other Ambulatory Visit: Payer: Medicare Other

## 2019-03-12 ENCOUNTER — Encounter: Payer: Self-pay | Admitting: Hematology and Oncology

## 2019-03-12 VITALS — BP 136/95 | HR 90 | Temp 98.4°F | Resp 16 | Wt 129.7 lb

## 2019-03-12 DIAGNOSIS — D649 Anemia, unspecified: Secondary | ICD-10-CM

## 2019-03-12 DIAGNOSIS — F17210 Nicotine dependence, cigarettes, uncomplicated: Secondary | ICD-10-CM | POA: Insufficient documentation

## 2019-03-12 DIAGNOSIS — R634 Abnormal weight loss: Secondary | ICD-10-CM | POA: Diagnosis not present

## 2019-03-12 DIAGNOSIS — R5381 Other malaise: Secondary | ICD-10-CM | POA: Diagnosis not present

## 2019-03-12 DIAGNOSIS — D638 Anemia in other chronic diseases classified elsewhere: Secondary | ICD-10-CM | POA: Insufficient documentation

## 2019-03-12 DIAGNOSIS — G600 Hereditary motor and sensory neuropathy: Secondary | ICD-10-CM | POA: Insufficient documentation

## 2019-03-12 DIAGNOSIS — K219 Gastro-esophageal reflux disease without esophagitis: Secondary | ICD-10-CM | POA: Insufficient documentation

## 2019-03-12 DIAGNOSIS — M25519 Pain in unspecified shoulder: Secondary | ICD-10-CM

## 2019-03-12 DIAGNOSIS — D473 Essential (hemorrhagic) thrombocythemia: Secondary | ICD-10-CM

## 2019-03-12 DIAGNOSIS — I10 Essential (primary) hypertension: Secondary | ICD-10-CM | POA: Diagnosis not present

## 2019-03-12 DIAGNOSIS — R12 Heartburn: Secondary | ICD-10-CM | POA: Insufficient documentation

## 2019-03-12 DIAGNOSIS — Z793 Long term (current) use of hormonal contraceptives: Secondary | ICD-10-CM | POA: Insufficient documentation

## 2019-03-12 DIAGNOSIS — R5383 Other fatigue: Secondary | ICD-10-CM

## 2019-03-12 DIAGNOSIS — R531 Weakness: Secondary | ICD-10-CM | POA: Diagnosis not present

## 2019-03-12 DIAGNOSIS — D75839 Thrombocytosis, unspecified: Secondary | ICD-10-CM

## 2019-03-12 DIAGNOSIS — Z8701 Personal history of pneumonia (recurrent): Secondary | ICD-10-CM

## 2019-03-12 DIAGNOSIS — Z79899 Other long term (current) drug therapy: Secondary | ICD-10-CM | POA: Insufficient documentation

## 2019-03-12 LAB — CBC WITH DIFFERENTIAL/PLATELET
Abs Immature Granulocytes: 0.03 10*3/uL (ref 0.00–0.07)
Basophils Absolute: 0 10*3/uL (ref 0.0–0.1)
Basophils Relative: 0 %
Eosinophils Absolute: 0.3 10*3/uL (ref 0.0–0.5)
Eosinophils Relative: 3 %
HCT: 38 % (ref 36.0–46.0)
Hemoglobin: 12.5 g/dL (ref 12.0–15.0)
Immature Granulocytes: 0 %
Lymphocytes Relative: 18 %
Lymphs Abs: 1.7 10*3/uL (ref 0.7–4.0)
MCH: 30.6 pg (ref 26.0–34.0)
MCHC: 32.9 g/dL (ref 30.0–36.0)
MCV: 93.1 fL (ref 80.0–100.0)
Monocytes Absolute: 0.6 10*3/uL (ref 0.1–1.0)
Monocytes Relative: 6 %
Neutro Abs: 6.9 10*3/uL (ref 1.7–7.7)
Neutrophils Relative %: 73 %
Platelets: 371 10*3/uL (ref 150–400)
RBC: 4.08 MIL/uL (ref 3.87–5.11)
RDW: 13.9 % (ref 11.5–15.5)
WBC: 9.5 10*3/uL (ref 4.0–10.5)
nRBC: 0 % (ref 0.0–0.2)

## 2019-03-12 LAB — IRON AND TIBC
Iron: 37 ug/dL (ref 28–170)
Saturation Ratios: 18 % (ref 10.4–31.8)
TIBC: 211 ug/dL — ABNORMAL LOW (ref 250–450)
UIBC: 174 ug/dL

## 2019-03-12 LAB — FERRITIN: Ferritin: 52 ng/mL (ref 11–307)

## 2019-03-12 LAB — SEDIMENTATION RATE: Sed Rate: 10 mm/hr (ref 0–20)

## 2019-03-12 NOTE — Progress Notes (Signed)
Pt here for follow up. Reports bilateral arm pain and weakness x 2 months. Currently taking Clindamycin for 2 boils on buttocks. Denies any other concerns.

## 2019-11-27 HISTORY — PX: OTHER SURGICAL HISTORY: SHX169

## 2021-01-17 ENCOUNTER — Other Ambulatory Visit: Payer: Self-pay | Admitting: Gastroenterology

## 2021-01-17 DIAGNOSIS — R1011 Right upper quadrant pain: Secondary | ICD-10-CM

## 2021-01-17 DIAGNOSIS — R1013 Epigastric pain: Secondary | ICD-10-CM

## 2021-01-17 DIAGNOSIS — K839 Disease of biliary tract, unspecified: Secondary | ICD-10-CM

## 2021-01-17 DIAGNOSIS — K8020 Calculus of gallbladder without cholecystitis without obstruction: Secondary | ICD-10-CM

## 2021-01-29 ENCOUNTER — Other Ambulatory Visit: Payer: Self-pay

## 2021-01-29 ENCOUNTER — Ambulatory Visit
Admission: RE | Admit: 2021-01-29 | Discharge: 2021-01-29 | Disposition: A | Discharge status: Home / Self Care | Payer: Medicare Other | Source: Ambulatory Visit | Attending: Gastroenterology | Admitting: Gastroenterology

## 2021-01-29 ENCOUNTER — Other Ambulatory Visit: Payer: Self-pay | Admitting: Gastroenterology

## 2021-01-29 DIAGNOSIS — K8020 Calculus of gallbladder without cholecystitis without obstruction: Secondary | ICD-10-CM

## 2021-01-29 DIAGNOSIS — K839 Disease of biliary tract, unspecified: Secondary | ICD-10-CM | POA: Insufficient documentation

## 2021-01-29 DIAGNOSIS — R1013 Epigastric pain: Secondary | ICD-10-CM | POA: Diagnosis not present

## 2021-01-29 DIAGNOSIS — R1011 Right upper quadrant pain: Secondary | ICD-10-CM | POA: Diagnosis present

## 2021-01-29 MED ORDER — GADOBUTROL 1 MMOL/ML IV SOLN
6.0000 mL | Freq: Once | INTRAVENOUS | Status: AC | PRN
  Administered 2021-01-29: 6 mL via INTRAVENOUS

## 2021-02-17 ENCOUNTER — Other Ambulatory Visit: Payer: Self-pay

## 2021-02-17 ENCOUNTER — Encounter
Admission: RE | Admit: 2021-02-17 | Discharge: 2021-02-17 | Disposition: A | Discharge status: Home / Self Care | Payer: Medicare Other | Source: Ambulatory Visit | Attending: General Surgery | Admitting: General Surgery

## 2021-02-17 ENCOUNTER — Other Ambulatory Visit: Payer: Self-pay | Admitting: Obstetrics and Gynecology

## 2021-02-17 ENCOUNTER — Ambulatory Visit: Payer: Self-pay | Admitting: General Surgery

## 2021-02-17 MED ORDER — LACTATED RINGERS IV SOLN: INTRAVENOUS | Status: DC

## 2021-02-17 MED ORDER — INDOCYANINE GREEN 25 MG IV SOLR
1.2500 mg | Freq: Once | INTRAVENOUS | Status: AC
  Administered 2021-02-18: 1.25 mg via INTRAVENOUS
  Filled 2021-02-17: qty 0.5

## 2021-02-17 MED ORDER — ACETAMINOPHEN 500 MG PO TABS: 1000.0000 mg | ORAL_TABLET | ORAL | Status: AC

## 2021-02-17 MED ORDER — CEFAZOLIN SODIUM-DEXTROSE 2-4 GM/100ML-% IV SOLN
2.0000 g | INTRAVENOUS | Status: AC
  Administered 2021-02-18: 2 g via INTRAVENOUS

## 2021-02-17 MED ORDER — CEFAZOLIN SODIUM-DEXTROSE 2-4 GM/100ML-% IV SOLN: 2.0000 g | INTRAVENOUS | Status: DC

## 2021-02-17 MED ORDER — POVIDONE-IODINE 10 % EX SWAB: 2.0000 "application " | Freq: Once | CUTANEOUS | Status: DC

## 2021-02-17 NOTE — H&P (View-Only) (Signed)
Chief Complaint:    Patient ID: YSIDRA SOPHER is a 45 y.o. female presenting with Pre Op Consulting  on 02/17/2021  HPI: New patient to me. Planning for joint surgery with gen surg. Pt with significant RUQ and RLQ pain. 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  She endorses significant pelvic pain in the right.   She also reports IUD in the uterus that is 43 years old but strings are los. She requests removal. No periods since it was placed.  HTN Body mass index is 15.74 kg/m. 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  Past Surgical History:  has a past surgical history that includes meniscus repair (Right, 02/2014); Tonsillectomy; and Appendectomy. Family History: family history includes Charcot-Marie-Tooth disease in her father; Diabetes type II in her father, paternal grandmother, and paternal uncle; Early menopause in her mother; Heart disease in her father; Irregular Heart Beat (Arrhythmia) in her sister; Multiple sclerosis in her father; No Known Problems in her daughter and son; Testicular cancer in her father. Social History:  reports that she has been smoking. She has a 12.50 pack-year smoking history. She has never used smokeless tobacco. She reports previous alcohol use of about 4.0 - 5.0 standard drinks of alcohol per week. She reports that she does not use drugs. OB/GYN History:  OB History     Gravida  2   Para  2   Term  1   Preterm  1   AB      Living  2      SAB      IAB      Ectopic      Molar      Multiple      Live Births  2          Allergies: is allergic to codeine, sulfa (sulfonamide antibiotics), sumatriptan, and tylenol w codeine  [acetaminophen-codeine]. Medications:  Current Outpatient Medications:    cephalexin (KEFLEX) 500 MG capsule, Take by mouth, Disp: , Rfl:    cyanocobalamin (VITAMIN B12) 1000 MCG tablet, Take by mouth, Disp: , Rfl:    doxycycline (MONODOX) 100 MG capsule, Take 100 mg by mouth 2 (two) times daily, Disp: , Rfl:    gabapentin (NEURONTIN) 600 MG tablet, Take 2 tablets (1,200 mg total) by mouth 3 (three) times daily, Disp: 180 tablet, Rfl: 3   levonorgestreL (MIRENA 52 MG) 20 mcg/24 hr (6 years) IUD, Insert 1 each into the uterus once Follow package directions., Disp: , Rfl:    melatonin 3 mg tablet, Take 2 tablets (6 mg total) by mouth nightly, Disp: 60 tablet, Rfl: 0   multivitamin with minerals tablet, Take 1 tablet by mouth once daily, Disp: , Rfl:    omeprazole (PRILOSEC) 40 MG DR capsule, Take 1 capsule (40 mg total) by mouth 2 (two) times daily before meals, Disp: 180 capsule, Rfl: 3   ondansetron (ZOFRAN-ODT) 4 MG disintegrating tablet, Take 1 tablet (4 mg total) by mouth every 8 (eight) hours as needed for Nausea, Disp: 30 tablet, Rfl: 1   traZODone (DESYREL) 50 MG tablet, Take 1 tablet (50 mg total) by mouth nightly as needed for Sleep for up to 180 days, Disp: 90 tablet, Rfl: 1   albuterol 90 mcg/actuation inhaler, Inhale 2 inhalations into the lungs  4 (four) times daily as needed for Wheezing or Shortness of Breath, Disp: 1 Inhaler, Rfl: 1   oxyCODONE (DAZIDOX) 10 mg immediate release tablet, Take 10 mg by mouth every 4 (four) hours as needed (Patient not taking: Reported on 02/17/2021), Disp: , Rfl:   Review of Systems: No SOB, no palpitations or chest pain, no new lower extremity edema, no nausea or vomiting or bowel or bladder complaints. See HPI for gyn specific ROS.   Exam:     BP (!) 149/91   Pulse 84   Ht 175.3 cm (5\' 9" )   Wt 48.4 kg (106 lb 9.6 oz)   BMI 15.74 kg/m   General: Patient is well-groomed, well-nourished, appears stated age in no acute distress   HEENT: head  is atraumatic and normocephalic, trachea is midline, neck is supple with no palpable nodules   CV: Regular rhythm and normal heart rate, no murmur   Pulm: Clear to auscultation throughout lung fields with no wheezing, crackles, or rhonchi. 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 A diagnosis of Intrauterine contraceptive device threads lost, initial encounter was also pertinent to this visit.    Plan:   Patient presents for a preoperative discussion regarding her plans to proceed with definitive surgical treatment of her right DERMOID by  robotic assisted right salpingoophorectomy.  I recommend robotic-assistance because of her hx of gall stones. This will be a joint surgery with gen surg for lap chole.  She also needs IUD removal, with lost strings, and we will plan for hysteroscopic IUD removal.  The patient and I discussed the technical aspects of the procedure including the potential for risks and complications.  These include but are not limited to the risk of infection requiring post-operative antibiotics or further procedures.  We talked about the risk of injury to adjacent organs including bladder, bowel, ureter, blood vessels or nerves.  We talked about the need to convert to an open incision.  We talked about the possible need for blood transfusion.  We talked about postop complications such as thromboembolic or cardiopulmonary complications.  All of her questions were answered.  Her preoperative exam was completed and the appropriate consents were signed. She is scheduled to undergo this procedure in the near future.  Postop check in 2 weeks  Chyanne Kohut Monika Salk, MD

## 2021-02-17 NOTE — Patient Instructions (Addendum)
Your procedure is scheduled on: February 18, 2021 FRIDAY Report to the Registration Desk on the 1st floor of the [facility]. To find out your arrival time, please call [phone] between 1PM - 3PM on: Thursday February 17, 2021  REMEMBER: Instructions that are not followed completely may result in serious medical risk, up to and including death; or upon the discretion of your surgeon and anesthesiologist your surgery may need to be rescheduled.  DO NOT EAT OR DRINK after midnight the night before surgery.  No gum chewing, lozengers or hard candies.  TAKE THESE MEDICATIONS THE MORNING OF SURGERY WITH A SIP OF WATER: GABAPENTIN OMPRAZOLE (take one the night before and one on the morning of surgery - helps to prevent nausea after surgery.)  Use inhalers on the day of surgery   Follow recommendations from Cardiologist, Pulmonologist or PCP regarding stopping Aspirin, Coumadin, Plavix, Eliquis, Pradaxa, or Pletal.  One week prior to surgery: Stop Anti-inflammatories (NSAIDS) such as Advil, Aleve, Ibuprofen, Motrin, Naproxen, Naprosyn and ASPIRIN OR Aspirin based products such as Excedrin, Goodys Powder, BC Powder. Stop ANY OVER THE COUNTER supplements until after surgery. You may however, continue to take Tylenol if needed for pain up until the day of surgery.  No Alcohol for 24 hours before or after surgery.  No Smoking including e-cigarettes for 24 hours prior to surgery.  No chewable tobacco products for at least 6 hours prior to surgery.  No nicotine patches on the day of surgery.  Do not use any "recreational" drugs for at least a week prior to your surgery.  Please be advised that the combination of cocaine and anesthesia may have negative outcomes, up to and including death. If you test positive for cocaine, your surgery will be cancelled.  On the morning of surgery brush your teeth with toothpaste and water, you may rinse your mouth with mouthwash if you wish. Do not swallow any  toothpaste or mouthwash.  Do not wear jewelry, make-up, hairpins, clips or nail polish.  Do not wear lotions, powders, or perfumes.   Do not shave body from the neck down 48 hours prior to surgery just in case you cut yourself which could leave a site for infection.  Also, freshly shaved skin may become irritated if using the CHG soap.  Contact lenses, hearing aids and dentures may not be worn into surgery.  Do not bring valuables to the hospital. [facility] is not responsible for any missing/lost belongings or valuables.   SHOWER BEFORE SURGERY  Notify your doctor if there is any change in your medical condition (cold, fever, infection).  Wear comfortable clothing (specific to your surgery type) to the hospital.  After surgery, you can help prevent lung complications by doing breathing exercises.  Take deep breaths and cough every 1-2 hours. Your doctor may order a device called an Incentive Spirometer to help you take deep breaths. When coughing or sneezing, hold a pillow firmly against your incision with both hands. This is called "splinting." Doing this helps protect your incision. It also decreases belly discomfort.   If you are being discharged the day of surgery, you will not be allowed to drive home. You will need a responsible adult (18 years or older) to drive you home and stay with you that night.   If you are taking public transportation, you will need to have a responsible adult (18 years or older) with you. Please confirm with your physician that it is acceptable to use public transportation.  Please call the [facility] at [phone] if you have any questions about these instructions.  Surgery Visitation Policy:  Patients undergoing a surgery or procedure may have one family member or support person with them as long as that person is not COVID-19 positive or experiencing its symptoms.  That person may remain in the waiting area during the  procedure.

## 2021-02-17 NOTE — Progress Notes (Signed)
Chief Complaint:    Patient ID: Lori Moon is a 45 y.o. female presenting with Pre Op Consulting  on 02/17/2021  HPI: New patient to me. Planning for joint surgery with gen surg. Pt with significant RUQ and RLQ pain. 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  She endorses significant pelvic pain in the right.   She also reports IUD in the uterus that is 43 years old but strings are los. She requests removal. No periods since it was placed.  HTN Body mass index is 15.74 kg/m. 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  Past Surgical History:  has a past surgical history that includes meniscus repair (Right, 02/2014); Tonsillectomy; and Appendectomy. Family History: family history includes Charcot-Marie-Tooth disease in her father; Diabetes type II in her father, paternal grandmother, and paternal uncle; Early menopause in her mother; Heart disease in her father; Irregular Heart Beat (Arrhythmia) in her sister; Multiple sclerosis in her father; No Known Problems in her daughter and son; Testicular cancer in her father. Social History:  reports that she has been smoking. She has a 12.50 pack-year smoking history. She has never used smokeless tobacco. She reports previous alcohol use of about 4.0 - 5.0 standard drinks of alcohol per week. She reports that she does not use drugs. OB/GYN History:  OB History     Gravida  2   Para  2   Term  1   Preterm  1   AB      Living  2      SAB      IAB      Ectopic      Molar      Multiple      Live Births  2          Allergies: is allergic to codeine, sulfa (sulfonamide antibiotics), sumatriptan, and tylenol w codeine  [acetaminophen-codeine]. Medications:  Current Outpatient Medications:    cephalexin (KEFLEX) 500 MG capsule, Take by mouth, Disp: , Rfl:    cyanocobalamin (VITAMIN B12) 1000 MCG tablet, Take by mouth, Disp: , Rfl:    doxycycline (MONODOX) 100 MG capsule, Take 100 mg by mouth 2 (two) times daily, Disp: , Rfl:    gabapentin (NEURONTIN) 600 MG tablet, Take 2 tablets (1,200 mg total) by mouth 3 (three) times daily, Disp: 180 tablet, Rfl: 3   levonorgestreL (MIRENA 52 MG) 20 mcg/24 hr (6 years) IUD, Insert 1 each into the uterus once Follow package directions., Disp: , Rfl:    melatonin 3 mg tablet, Take 2 tablets (6 mg total) by mouth nightly, Disp: 60 tablet, Rfl: 0   multivitamin with minerals tablet, Take 1 tablet by mouth once daily, Disp: , Rfl:    omeprazole (PRILOSEC) 40 MG DR capsule, Take 1 capsule (40 mg total) by mouth 2 (two) times daily before meals, Disp: 180 capsule, Rfl: 3   ondansetron (ZOFRAN-ODT) 4 MG disintegrating tablet, Take 1 tablet (4 mg total) by mouth every 8 (eight) hours as needed for Nausea, Disp: 30 tablet, Rfl: 1   traZODone (DESYREL) 50 MG tablet, Take 1 tablet (50 mg total) by mouth nightly as needed for Sleep for up to 180 days, Disp: 90 tablet, Rfl: 1   albuterol 90 mcg/actuation inhaler, Inhale 2 inhalations into the lungs  4 (four) times daily as needed for Wheezing or Shortness of Breath, Disp: 1 Inhaler, Rfl: 1   oxyCODONE (DAZIDOX) 10 mg immediate release tablet, Take 10 mg by mouth every 4 (four) hours as needed (Patient not taking: Reported on 02/17/2021), Disp: , Rfl:   Review of Systems: No SOB, no palpitations or chest pain, no new lower extremity edema, no nausea or vomiting or bowel or bladder complaints. See HPI for gyn specific ROS.   Exam:     BP (!) 149/91   Pulse 84   Ht 175.3 cm (5\' 9" )   Wt 48.4 kg (106 lb 9.6 oz)   BMI 15.74 kg/m   General: Patient is well-groomed, well-nourished, appears stated age in no acute distress   HEENT: head  is atraumatic and normocephalic, trachea is midline, neck is supple with no palpable nodules   CV: Regular rhythm and normal heart rate, no murmur   Pulm: Clear to auscultation throughout lung fields with no wheezing, crackles, or rhonchi. 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 A diagnosis of Intrauterine contraceptive device threads lost, initial encounter was also pertinent to this visit.    Plan:   Patient presents for a preoperative discussion regarding her plans to proceed with definitive surgical treatment of her right DERMOID by  robotic assisted right salpingoophorectomy.  I recommend robotic-assistance because of her hx of gall stones. This will be a joint surgery with gen surg for lap chole.  She also needs IUD removal, with lost strings, and we will plan for hysteroscopic IUD removal.  The patient and I discussed the technical aspects of the procedure including the potential for risks and complications.  These include but are not limited to the risk of infection requiring post-operative antibiotics or further procedures.  We talked about the risk of injury to adjacent organs including bladder, bowel, ureter, blood vessels or nerves.  We talked about the need to convert to an open incision.  We talked about the possible need for blood transfusion.  We talked about postop complications such as thromboembolic or cardiopulmonary complications.  All of her questions were answered.  Her preoperative exam was completed and the appropriate consents were signed. She is scheduled to undergo this procedure in the near future.  Postop check in 2 weeks  Joaovictor Krone Monika Salk, MD

## 2021-02-18 ENCOUNTER — Ambulatory Visit: Payer: Medicare Other | Admitting: Registered Nurse

## 2021-02-18 ENCOUNTER — Ambulatory Visit
Admission: RE | Admit: 2021-02-18 | Discharge: 2021-02-18 | Disposition: A | Discharge status: Home / Self Care | Payer: Medicare Other | Attending: General Surgery | Admitting: General Surgery

## 2021-02-18 ENCOUNTER — Encounter: Payer: Self-pay | Admitting: General Surgery

## 2021-02-18 ENCOUNTER — Other Ambulatory Visit: Payer: Self-pay

## 2021-02-18 ENCOUNTER — Encounter
Admission: RE | Disposition: A | Discharge status: Home / Self Care | Payer: Self-pay | Source: Home / Self Care | Attending: General Surgery

## 2021-02-18 DIAGNOSIS — Z882 Allergy status to sulfonamides status: Secondary | ICD-10-CM | POA: Insufficient documentation

## 2021-02-18 DIAGNOSIS — I10 Essential (primary) hypertension: Secondary | ICD-10-CM | POA: Diagnosis not present

## 2021-02-18 DIAGNOSIS — Z888 Allergy status to other drugs, medicaments and biological substances status: Secondary | ICD-10-CM | POA: Insufficient documentation

## 2021-02-18 DIAGNOSIS — Z79899 Other long term (current) drug therapy: Secondary | ICD-10-CM | POA: Insufficient documentation

## 2021-02-18 DIAGNOSIS — F17210 Nicotine dependence, cigarettes, uncomplicated: Secondary | ICD-10-CM | POA: Diagnosis not present

## 2021-02-18 DIAGNOSIS — K8010 Calculus of gallbladder with chronic cholecystitis without obstruction: Secondary | ICD-10-CM | POA: Diagnosis present

## 2021-02-18 DIAGNOSIS — D270 Benign neoplasm of right ovary: Secondary | ICD-10-CM | POA: Diagnosis not present

## 2021-02-18 DIAGNOSIS — Z885 Allergy status to narcotic agent status: Secondary | ICD-10-CM | POA: Insufficient documentation

## 2021-02-18 HISTORY — PX: ROBOTIC ASSISTED BILATERAL SALPINGO OOPHERECTOMY: SHX6078

## 2021-02-18 HISTORY — PX: HYSTEROSCOPY WITH D & C: SHX1775

## 2021-02-18 HISTORY — PX: IUD REMOVAL: SHX5392

## 2021-02-18 LAB — BASIC METABOLIC PANEL
Anion gap: 6 (ref 5–15)
BUN: <5 mg/dL — ABNORMAL LOW (ref 6–20)
CO2: 29 mmol/L (ref 22–32)
Calcium: 8.8 mg/dL — ABNORMAL LOW (ref 8.9–10.3)
Chloride: 98 mmol/L (ref 98–111)
Creatinine, Ser: 0.44 mg/dL (ref 0.44–1.00)
GFR, Estimated: >60 mL/min (ref >60)
Glucose, Bld: 80 mg/dL (ref 70–99)
Potassium: 3.7 mmol/L (ref 3.5–5.1)
Sodium: 133 mmol/L — ABNORMAL LOW (ref 135–145)

## 2021-02-18 LAB — CBC
HCT: 34.6 % — ABNORMAL LOW (ref 36.0–46.0)
Hemoglobin: 11.9 g/dL — ABNORMAL LOW (ref 12.0–15.0)
MCH: 31.8 pg (ref 26.0–34.0)
MCHC: 34.4 g/dL (ref 30.0–36.0)
MCV: 92.5 fL (ref 80.0–100.0)
Platelets: 354 10*3/uL (ref 150–400)
RBC: 3.74 MIL/uL — ABNORMAL LOW (ref 3.87–5.11)
RDW: 12.4 % (ref 11.5–15.5)
WBC: 7.5 10*3/uL (ref 4.0–10.5)
nRBC: 0 % (ref 0.0–0.2)

## 2021-02-18 LAB — POCT PREGNANCY, URINE: Preg Test, Ur: NEGATIVE

## 2021-02-18 LAB — TYPE AND SCREEN
ABO/RH(D): A POS
Antibody Screen: NEGATIVE

## 2021-02-18 SURGERY — CHOLECYSTECTOMY, ROBOT-ASSISTED, LAPAROSCOPIC
Anesthesia: General | Site: Abdomen | Laterality: Right

## 2021-02-18 MED ORDER — LIDOCAINE HCL (CARDIAC) PF 100 MG/5ML IV SOSY
PREFILLED_SYRINGE | INTRAVENOUS | Status: DC | PRN
  Administered 2021-02-18: 100 mg via INTRAVENOUS

## 2021-02-18 MED ORDER — STERILE WATER FOR INJECTION IV SOLN
INTRAVENOUS | Status: DC | PRN
  Administered 2021-02-18: 1 mL

## 2021-02-18 MED ORDER — ACETAMINOPHEN 500 MG PO TABS: 1000.0000 mg | ORAL_TABLET | Freq: Four times a day (QID) | ORAL | 0 refills | Status: AC

## 2021-02-18 MED ORDER — OXYCODONE HCL 5 MG PO TABS: 5.0000 mg | ORAL_TABLET | Freq: Three times a day (TID) | ORAL | 0 refills | Status: AC | PRN

## 2021-02-18 MED ORDER — OXYCODONE HCL 5 MG PO TABS
5.0000 mg | ORAL_TABLET | Freq: Once | ORAL | Status: AC
  Administered 2021-02-18: 5 mg via ORAL

## 2021-02-18 MED ORDER — KETAMINE HCL 50 MG/ML IJ SOLN
INTRAMUSCULAR | Status: DC | PRN
  Administered 2021-02-18 (×3): 12.5 mg via INTRAMUSCULAR

## 2021-02-18 MED ORDER — ONDANSETRON HCL 4 MG/2ML IJ SOLN
INTRAMUSCULAR | Status: DC | PRN
  Administered 2021-02-18: 4 mg via INTRAVENOUS

## 2021-02-18 MED ORDER — FENTANYL CITRATE (PF) 100 MCG/2ML IJ SOLN
INTRAMUSCULAR | Status: AC
  Filled 2021-02-18: qty 2

## 2021-02-18 MED ORDER — BUPIVACAINE-EPINEPHRINE 0.25% -1:200000 IJ SOLN
INTRAMUSCULAR | Status: DC | PRN
  Administered 2021-02-18: 13 mL
  Administered 2021-02-18: 17 mL

## 2021-02-18 MED ORDER — ROCURONIUM BROMIDE 100 MG/10ML IV SOLN
INTRAVENOUS | Status: DC | PRN
  Administered 2021-02-18: 30 mg via INTRAVENOUS
  Administered 2021-02-18: 50 mg via INTRAVENOUS

## 2021-02-18 MED ORDER — HYDROMORPHONE HCL 1 MG/ML IJ SOLN
0.5000 mg | INTRAMUSCULAR | Status: DC | PRN
Start: 2021-02-18 — End: 2021-02-18
  Administered 2021-02-18 (×2): 0.5 mg via INTRAVENOUS

## 2021-02-18 MED ORDER — PROPOFOL 10 MG/ML IV BOLUS
INTRAVENOUS | Status: AC
  Filled 2021-02-18: qty 20

## 2021-02-18 MED ORDER — SILVER NITRATE-POT NITRATE 75-25 % EX MISC
CUTANEOUS | Status: AC
  Filled 2021-02-18: qty 10

## 2021-02-18 MED ORDER — KETOROLAC TROMETHAMINE 30 MG/ML IJ SOLN
INTRAMUSCULAR | Status: AC
  Filled 2021-02-18: qty 1

## 2021-02-18 MED ORDER — DEXAMETHASONE SODIUM PHOSPHATE 10 MG/ML IJ SOLN
INTRAMUSCULAR | Status: DC | PRN
  Administered 2021-02-18: 4 mg via INTRAVENOUS

## 2021-02-18 MED ORDER — CEFAZOLIN SODIUM-DEXTROSE 2-4 GM/100ML-% IV SOLN
INTRAVENOUS | Status: AC
  Filled 2021-02-18: qty 100

## 2021-02-18 MED ORDER — HYDROMORPHONE HCL 1 MG/ML IJ SOLN
INTRAMUSCULAR | Status: AC
  Filled 2021-02-18: qty 1

## 2021-02-18 MED ORDER — FENTANYL CITRATE (PF) 100 MCG/2ML IJ SOLN
25.0000 ug | INTRAMUSCULAR | Status: DC | PRN
  Administered 2021-02-18: 25 ug via INTRAVENOUS
  Administered 2021-02-18 (×2): 50 ug via INTRAVENOUS
  Administered 2021-02-18: 25 ug via INTRAVENOUS

## 2021-02-18 MED ORDER — LIDOCAINE HCL (PF) 2 % IJ SOLN
INTRAMUSCULAR | Status: AC
  Filled 2021-02-18: qty 5

## 2021-02-18 MED ORDER — ONDANSETRON HCL 4 MG/2ML IJ SOLN
INTRAMUSCULAR | Status: AC
  Filled 2021-02-18: qty 2

## 2021-02-18 MED ORDER — METOPROLOL TARTRATE 5 MG/5ML IV SOLN
INTRAVENOUS | Status: DC | PRN
  Administered 2021-02-18 (×2): 1 mg via INTRAVENOUS

## 2021-02-18 MED ORDER — FENTANYL CITRATE (PF) 100 MCG/2ML IJ SOLN
INTRAMUSCULAR | Status: DC | PRN
  Administered 2021-02-18 (×2): 50 ug via INTRAVENOUS

## 2021-02-18 MED ORDER — BUPIVACAINE-EPINEPHRINE (PF) 0.25% -1:200000 IJ SOLN
INTRAMUSCULAR | Status: AC
  Filled 2021-02-18: qty 30

## 2021-02-18 MED ORDER — SUGAMMADEX SODIUM 200 MG/2ML IV SOLN
INTRAVENOUS | Status: DC | PRN
  Administered 2021-02-18: 200 mg via INTRAVENOUS

## 2021-02-18 MED ORDER — PHENYLEPHRINE HCL (PRESSORS) 10 MG/ML IV SOLN
INTRAVENOUS | Status: DC | PRN
  Administered 2021-02-18: 100 ug via INTRAVENOUS

## 2021-02-18 MED ORDER — KETOROLAC TROMETHAMINE 30 MG/ML IJ SOLN
INTRAMUSCULAR | Status: DC | PRN
  Administered 2021-02-18: 30 mg via INTRAVENOUS

## 2021-02-18 MED ORDER — DEXAMETHASONE SODIUM PHOSPHATE 10 MG/ML IJ SOLN
INTRAMUSCULAR | Status: AC
  Filled 2021-02-18: qty 1

## 2021-02-18 MED ORDER — CHLORHEXIDINE GLUCONATE 0.12 % MT SOLN: 15.0000 mL | Freq: Once | OROMUCOSAL | Status: AC

## 2021-02-18 MED ORDER — MEPERIDINE HCL 25 MG/ML IJ SOLN: 6.2500 mg | INTRAMUSCULAR | Status: DC | PRN

## 2021-02-18 MED ORDER — ROCURONIUM BROMIDE 10 MG/ML (PF) SYRINGE
PREFILLED_SYRINGE | INTRAVENOUS | Status: AC
  Filled 2021-02-18: qty 10

## 2021-02-18 MED ORDER — ONDANSETRON HCL 4 MG/2ML IJ SOLN
4.0000 mg | Freq: Once | INTRAMUSCULAR | Status: AC | PRN
  Administered 2021-02-18: 4 mg via INTRAVENOUS

## 2021-02-18 MED ORDER — METOPROLOL TARTRATE 5 MG/5ML IV SOLN
INTRAVENOUS | Status: AC
  Filled 2021-02-18: qty 5

## 2021-02-18 MED ORDER — IBUPROFEN 800 MG PO TABS: 800.0000 mg | ORAL_TABLET | Freq: Three times a day (TID) | ORAL | 1 refills | Status: AC

## 2021-02-18 MED ORDER — MIDAZOLAM HCL 2 MG/2ML IJ SOLN
INTRAMUSCULAR | Status: AC
  Filled 2021-02-18: qty 2

## 2021-02-18 MED ORDER — ORAL CARE MOUTH RINSE: 15.0000 mL | Freq: Once | OROMUCOSAL | Status: AC

## 2021-02-18 MED ORDER — OXYCODONE HCL 5 MG PO TABS
ORAL_TABLET | ORAL | Status: AC
  Filled 2021-02-18: qty 1

## 2021-02-18 MED ORDER — ACETAMINOPHEN 500 MG PO TABS
ORAL_TABLET | ORAL | Status: AC
  Administered 2021-02-18: 1000 mg via ORAL
  Filled 2021-02-18: qty 2

## 2021-02-18 MED ORDER — BUPIVACAINE HCL (PF) 0.5 % IJ SOLN
INTRAMUSCULAR | Status: AC
  Filled 2021-02-18: qty 30

## 2021-02-18 MED ORDER — DOCUSATE SODIUM 100 MG PO CAPS: 100.0000 mg | ORAL_CAPSULE | Freq: Two times a day (BID) | ORAL | 0 refills | Status: AC

## 2021-02-18 MED ORDER — MIDAZOLAM HCL 2 MG/2ML IJ SOLN
INTRAMUSCULAR | Status: DC | PRN
  Administered 2021-02-18: 2 mg via INTRAVENOUS

## 2021-02-18 MED ORDER — CHLORHEXIDINE GLUCONATE 0.12 % MT SOLN
OROMUCOSAL | Status: AC
  Administered 2021-02-18: 15 mL via OROMUCOSAL
  Filled 2021-02-18: qty 15

## 2021-02-18 MED ORDER — PROPOFOL 10 MG/ML IV BOLUS
INTRAVENOUS | Status: DC | PRN
  Administered 2021-02-18: 150 mg via INTRAVENOUS

## 2021-02-18 SURGICAL SUPPLY — 74 items
ADH SKN CLS APL DERMABOND .7 (GAUZE/BANDAGES/DRESSINGS) ×3
APL PRP STRL LF DISP 70% ISPRP (MISCELLANEOUS) ×3
BACTOSHIELD CHG 4% 4OZ (MISCELLANEOUS) ×2
BAG SPEC RTRVL LRG 6X4 10 (ENDOMECHANICALS) ×6
BLADE SURG SZ11 CARB STEEL (BLADE) ×8 IMPLANT
CANISTER SUCT 1200ML W/VALVE (MISCELLANEOUS) ×8 IMPLANT
CANNULA REDUC XI 12-8 STAPL (CANNULA) ×1
CANNULA REDUC XI 12-8MM STAPL (CANNULA) ×1
CANNULA REDUCER 12-8 DVNC XI (CANNULA) ×3 IMPLANT
CHLORAPREP W/TINT 26 (MISCELLANEOUS) ×8 IMPLANT
CLIP VESOLOCK MED LG 6/CT (CLIP) ×5 IMPLANT
COVER TIP SHEARS 8 DVNC (MISCELLANEOUS) ×3 IMPLANT
COVER TIP SHEARS 8MM DA VINCI (MISCELLANEOUS) ×2
COVER WAND RF STERILE (DRAPES) ×8 IMPLANT
DEFOGGER SCOPE WARMER CLEARIFY (MISCELLANEOUS) ×8 IMPLANT
DERMABOND ADVANCED (GAUZE/BANDAGES/DRESSINGS) ×2
DERMABOND ADVANCED .7 DNX12 (GAUZE/BANDAGES/DRESSINGS) ×6 IMPLANT
DRAPE ARM DVNC X/XI (DISPOSABLE) ×24 IMPLANT
DRAPE COLUMN DVNC XI (DISPOSABLE) ×6 IMPLANT
DRAPE DA VINCI XI ARM (DISPOSABLE) ×8
DRAPE DA VINCI XI COLUMN (DISPOSABLE) ×4
DRSG TELFA 3X8 NADH (GAUZE/BANDAGES/DRESSINGS) ×5 IMPLANT
ELECT REM PT RETURN 9FT ADLT (ELECTROSURGICAL) ×5
ELECTRODE REM PT RTRN 9FT ADLT (ELECTROSURGICAL) ×6 IMPLANT
GAUZE 4X4 16PLY RFD (DISPOSABLE) ×4 IMPLANT
GLOVE SURG ENC MOIS LTX SZ6.5 (GLOVE) ×10 IMPLANT
GLOVE SURG ENC MOIS LTX SZ7 (GLOVE) ×20 IMPLANT
GLOVE SURG UNDER LTX SZ7.5 (GLOVE) ×16 IMPLANT
GLOVE SURG UNDER POLY LF SZ6.5 (GLOVE) ×10 IMPLANT
GOWN STRL REUS W/ TWL LRG LVL3 (GOWN DISPOSABLE) ×33 IMPLANT
GOWN STRL REUS W/TWL LRG LVL3 (GOWN DISPOSABLE) ×30
GRASPER SUT TROCAR 14GX15 (MISCELLANEOUS) ×8 IMPLANT
IV NS IRRIG 3000ML ARTHROMATIC (IV SOLUTION) ×5 IMPLANT
KIT PINK PAD W/HEAD ARE REST (MISCELLANEOUS) ×5
KIT PINK PAD W/HEAD ARM REST (MISCELLANEOUS) ×6 IMPLANT
KIT PROCEDURE FLUENT (KITS) ×5 IMPLANT
LABEL OR SOLS (LABEL) ×8 IMPLANT
NDL INSUFFLATION 14GA 120MM (NEEDLE) ×3 IMPLANT
NEEDLE HYPO 22GX1.5 SAFETY (NEEDLE) ×5 IMPLANT
NEEDLE INSUFFLATION 14GA 120MM (NEEDLE) ×5 IMPLANT
NS IRRIG 1000ML POUR BTL (IV SOLUTION) ×5 IMPLANT
NS IRRIG 500ML POUR BTL (IV SOLUTION) ×5 IMPLANT
OBTURATOR OPTICAL STANDARD 8MM (TROCAR) ×2
OBTURATOR OPTICAL STND 8 DVNC (TROCAR) ×3
OBTURATOR OPTICALSTD 8 DVNC (TROCAR) ×6 IMPLANT
PACK LAP CHOLECYSTECTOMY (MISCELLANEOUS) ×5 IMPLANT
PAD DRESSING TELFA 3X8 NADH (GAUZE/BANDAGES/DRESSINGS) IMPLANT
PAD OB MATERNITY 4.3X12.25 (PERSONAL CARE ITEMS) ×5 IMPLANT
PAD PREP 24X41 OB/GYN DISP (PERSONAL CARE ITEMS) ×5 IMPLANT
POUCH ENDO CATCH 10MM SPEC (MISCELLANEOUS) ×4 IMPLANT
POUCH SPECIMEN RETRIEVAL 10MM (ENDOMECHANICALS) ×7 IMPLANT
SCRUB CHG 4% DYNA-HEX 4OZ (MISCELLANEOUS) ×3 IMPLANT
SEAL CANN UNIV 5-8 DVNC XI (MISCELLANEOUS) ×21 IMPLANT
SEAL ROD LENS SCOPE MYOSURE (ABLATOR) ×5 IMPLANT
SEAL XI 5MM-8MM UNIVERSAL (MISCELLANEOUS) ×6
SEALER VESSEL DA VINCI XI (MISCELLANEOUS) ×2
SEALER VESSEL EXT DVNC XI (MISCELLANEOUS) ×3 IMPLANT
SET TUBE SMOKE EVAC HIGH FLOW (TUBING) ×5 IMPLANT
SOL PREP PVP 2OZ (MISCELLANEOUS) ×5
SOLUTION ELECTROLUBE (MISCELLANEOUS) ×8 IMPLANT
SOLUTION PREP PVP 2OZ (MISCELLANEOUS) ×3 IMPLANT
SPONGE T-LAP 4X18 ~~LOC~~+RFID (SPONGE) ×2 IMPLANT
STAPLER CANNULA SEAL DVNC XI (STAPLE) ×3 IMPLANT
STAPLER CANNULA SEAL XI (STAPLE) ×2
SURGILUBE 2OZ TUBE FLIPTOP (MISCELLANEOUS) ×5 IMPLANT
SUT MNCRL 4-0 (SUTURE) ×10
SUT MNCRL 4-0 27XMFL (SUTURE) ×6
SUT VIC AB 0 CT2 27 (SUTURE) ×5 IMPLANT
SUT VIC AB 2-0 UR6 27 (SUTURE) ×2 IMPLANT
SUT VICRYL 0 AB UR-6 (SUTURE) ×5 IMPLANT
SUTURE MNCRL 4-0 27XMF (SUTURE) ×6 IMPLANT
TOWEL OR 17X26 4PK STRL BLUE (TOWEL DISPOSABLE) ×5 IMPLANT
TUBING CONNECTING 10 (TUBING) ×4 IMPLANT
TUBING CONNECTING 10' (TUBING) ×1

## 2021-02-18 NOTE — Anesthesia Procedure Notes (Signed)
Procedure Name: Intubation Date/Time: 02/18/2021 1:08 PM Performed by: Lia Foyer, CRNA Pre-anesthesia Checklist: Patient identified, Emergency Drugs available, Suction available and Patient being monitored Patient Re-evaluated:Patient Re-evaluated prior to induction Oxygen Delivery Method: Circle system utilized Preoxygenation: Pre-oxygenation with 100% oxygen Induction Type: IV induction Ventilation: Mask ventilation without difficulty Laryngoscope Size: McGraph and 3 Grade View: Grade I Tube type: Oral Tube size: 7.0 mm Number of attempts: 1 Airway Equipment and Method: Stylet and Video-laryngoscopy Placement Confirmation: ETT inserted through vocal cords under direct vision, positive ETCO2 and breath sounds checked- equal and bilateral Secured at: 19 cm Tube secured with: Tape Dental Injury: Teeth and Oropharynx as per pre-operative assessment

## 2021-02-18 NOTE — OR Nursing (Signed)
Dr. Windell Moment in to see pt in postop just prior to d/c.

## 2021-02-18 NOTE — Discharge Instructions (Addendum)
  Diet: Resume home heart healthy regular diet.   Activity: No heavy lifting >20 pounds (children, pets, laundry, garbage) or strenuous activity until follow-up, but light activity and walking are encouraged. Do not drive or drink alcohol if taking narcotic pain medications.  No intercourse for two weeks.   Wound care: Remove dressing tomorrow. Once dressing removed, may shower with soapy water and pat dry (do not rub incisions), but no baths or submerging incision underwater until follow-up. (no swimming)   Medications: Resume all home medications. For mild to moderate pain: acetaminophen (Tylenol) or ibuprofen (if no kidney disease). Combining Tylenol with alcohol can substantially increase your risk of causing liver disease. Narcotic pain medications, if prescribed, can be used for severe pain, though may cause nausea, constipation, and drowsiness. If you do not need the narcotic pain medication, you do not need to fill the prescription.  Take Miralax if develops constipation.   Call office [phone]) at any time if any questions, worsening pain, fevers/chills, bleeding, drainage from incision site, or other concerns. AMBULATORY SURGERY  DISCHARGE INSTRUCTIONS   The drugs that you were given will stay in your system until tomorrow so for the next 24 hours you should not:  Drive an automobile Make any legal decisions Drink any alcoholic beverage   You may resume regular meals tomorrow.  Today it is better to start with liquids and gradually work up to solid foods.  You may eat anything you prefer, but it is better to start with liquids, then soup and crackers, and gradually work up to solid foods.   Please notify your doctor immediately if you have any unusual bleeding, trouble breathing, redness and pain at the surgery site, drainage, fever, or pain not relieved by medication.    Additional Instructions:    Please contact your physician with any problems or Same Day  Surgery at [phone], Monday through Friday 6 am to 4 pm, or [place] at [facility] number at [phone].

## 2021-02-18 NOTE — H&P (Signed)
History of Present Illness Lori Moon is a 45 y.o. female with cholelithiasis and complex right ovarian cyst.  Patient with cholelithiasis that was evaluated at the office for cholecystectomy.  She has been having progressing pain episodes.  Surgery needed to be rescheduled earlier due to persistent pain.  Patient today with right upper quadrant pain.  No fevers.  Past Medical History Past Medical History:  Diagnosis Date   Bell palsy    Charcot-Marie-Tooth disease    GERD (gastroesophageal reflux disease)    Hypertension    Migraines        Past Surgical History:  Procedure Laterality Date   APPENDECTOMY     KNEE SURGERY     KNEE SURGERY Right    TONSILLECTOMY      Allergies  Allergen Reactions   Acetaminophen-Codeine Other (See Comments)    Had chest pain once. Has taken percocet, norco without problem   Codeine Other (See Comments)    Can't breath   Elemental Sulfur Hives   Sulfa Antibiotics Hives   Sumatriptan Nausea Only    Other Reaction: Not Assessed    Current Facility-Administered Medications  Medication Dose Route Frequency Provider Last Rate Last Admin   acetaminophen (TYLENOL) tablet 1,000 mg  1,000 mg Oral On Call to Kappa, MD       ceFAZolin (ANCEF) IVPB 2g/100 mL premix  2 g Intravenous On Call to [place], MD       ceFAZolin (ANCEF) IVPB 2g/100 mL premix  2 g Intravenous On Call to [place], MD       indocyanine green (IC-GREEN) injection 1.25 mg  1.25 mg Intravenous Once Herbert Pun, MD       lactated ringers infusion   Intravenous Continuous Benjaman Kindler, MD       povidone-iodine 10 % swab 2 application  2 application Topical Once Benjaman Kindler, MD       Current Outpatient Medications  Medication Sig Dispense Refill   albuterol (VENTOLIN HFA) 108 (90 Base) MCG/ACT inhaler Inhale 2 puffs into the lungs every 6 (six) hours as needed for wheezing or shortness of breath.     cephALEXin  (KEFLEX) 500 MG capsule Take 500 mg by mouth daily.     gabapentin (NEURONTIN) 600 MG tablet Take 1,200 mg by mouth 3 (three) times daily.     levonorgestrel (MIRENA) 20 MCG/24HR IUD 1 each by Intrauterine route once.     Melatonin 10 MG TABS Take 10 capsules by mouth at bedtime.     Multiple Vitamins-Minerals (MULTIVITAMIN WITH MINERALS) tablet Take 1 tablet by mouth daily.     omeprazole (PRILOSEC) 40 MG capsule Take 40 mg by mouth 2 (two) times daily before a meal.     ondansetron (ZOFRAN-ODT) 4 MG disintegrating tablet Take 4 mg by mouth every 8 (eight) hours as needed for nausea/vomiting.     traZODone (DESYREL) 50 MG tablet Take 50 mg by mouth at bedtime.     vitamin B-12 (CYANOCOBALAMIN) 1000 MCG tablet Take 1,000 mcg by mouth daily.     pantoprazole (PROTONIX) 40 MG tablet Take 1 tablet by mouth daily.      Family History Family History  Problem Relation Age of Onset   Other Mother        unknown medical history   Diabetes Father    Multiple sclerosis Father    Cancer Father    Heart disease Father        Social History Social History  Tobacco Use   Smoking status: Every Day    Packs/day: 0.50    Pack years: 0.00    Types: Cigarettes   Smokeless tobacco: Never  Vaping Use   Vaping Use: Every day   Start date: 10/27/2018   Substances: Nicotine, Flavoring   Devices: Unknown  Substance Use Topics   Alcohol use: Not Currently    Alcohol/week: 14.0 standard drinks    Types: 14 Cans of beer per week    Comment: patient quit drinking 10/2017   Drug use: No        ROS Full ROS of systems performed and is otherwise negative there than what is stated in the HPI  Physical Exam There were no vitals taken for this visit.  CONSTITUTIONAL: No distress, oriented x3 EYES: Pupils equal, round, and reactive to light, Sclera non-icteric. EARS, NOSE, MOUTH AND THROAT: The oropharynx is clear. Oral mucosa is pink and moist. Hearing is intact to voice.  NECK: Trachea is  midline, and there is no jugular venous distension. Thyroid is without palpable abnormalities. LYMPH NODES:  Lymph nodes in the neck are not enlarged. RESPIRATORY:  Lungs are clear, and breath sounds are equal bilaterally. Normal respiratory effort without pathologic use of accessory muscles. CARDIOVASCULAR: Heart is regular without murmurs, gallops, or rubs. GI: The abdomen is soft, tender right upper quadrant, and nondistended. There were no palpable masses. There was no hepatosplenomegaly. There were normal bowel sounds. GU: Deferred MUSCULOSKELETAL:  Normal muscle strength and tone in all four extremities.    SKIN: Skin turgor is normal. There are no pathologic skin lesions.  NEUROLOGIC:  Motor and sensation is grossly normal.  Cranial nerves are grossly intact. PSYCH:  Alert and oriented to person, place and time. Affect is normal.  Data Reviewed I personally reviewed the MRCP that shows no cholelithiasis.  There is cholelithiasis is negative without evidence of cholecystitis.   Assessment    Cholelithiasis with recurrent pain Complex ovarian cyst  Plan    Robot-assisted laparoscopic cholecystectomy Excision of ovarian cyst versus oophorectomy by gynecology.  Herbert Pun, MD  Herbert Pun 02/18/2021, 10:14 AM

## 2021-02-18 NOTE — Transfer of Care (Signed)
Immediate Anesthesia Transfer of Care Note  Patient: Lori Moon  Procedure(s) Performed: XI ROBOTIC ASSISTED LAPAROSCOPIC CHOLECYSTECTOMY (Abdomen) XI ROBOTIC ASSISTED BILATERAL SALPINGO RIGHT OOPHORECTOMY (Right) DILATATION AND CURETTAGE /HYSTEROSCOPY INTRAUTERINE DEVICE (IUD) REMOVAL  Patient Location: PACU  Anesthesia Type:General  Level of Consciousness: patient cooperative , drowsy  Airway & Oxygen Therapy: Patient Spontanous Breathing and Patient connected to face mask oxygen  Post-op Assessment: Report given to RN and Post -op Vital signs reviewed and stable  Post vital signs: Reviewed and stable  Last Vitals:  Vitals Value Taken Time  BP 135/88 02/18/21 1518  Temp 36.2 C 02/18/21 1518  Pulse 82 02/18/21 1522  Resp 17 02/18/21 1522  SpO2 100 % 02/18/21 1522  Vitals shown include unvalidated device data.  Last Pain:  Vitals:   02/18/21 1518  TempSrc:   PainSc: Asleep         Complications: No notable events documented.

## 2021-02-18 NOTE — Op Note (Signed)
[name] PROCEDURE DATE: 02/18/2021  PREOPERATIVE DIAGNOSIS: Right mature teratoma, lost IUD strings POSTOPERATIVE DIAGNOSIS: The same PROCEDURE:  Panel 1 XI ROBOTIC ASSISTED LAPAROSCOPIC CHOLECYSTECTOMY: 81829 (CPT) Panel 2 XI ROBOTIC ASSISTED BILATERAL SALPINGO RIGHT OOPHORECTOMY: 93716 (CPT) DILATATION AND CURETTAGE /HYSTEROSCOPY: 96789 (CPT) INTRAUTERINE DEVICE (IUD) REMOVAL: 38101 (CPT)  Joint procedure with general surgery, Dr. Windell Moment SURGEON:  Dr. Benjaman Kindler, MD ASSISTANT: CST Anesthesiologist:  Anesthesiologist: Molli Barrows, MD; Phill Mutter, MD; Emmie Niemann, MD CRNA: Lia Foyer, CRNA; Rolla Plate, CRNA  INDICATIONS: 45 y.o. F with RUQ pain and RLQ pain, with right sided dermoid cyst, here for definitive surgical management secondary to the indications listed under preoperative diagnoses; please see preoperative note for further details.  Risks of surgery were discussed with the patient including but not limited to: bleeding which may require transfusion or reoperation; infection which may require antibiotics; injury to bowel, bladder, ureters or other surrounding organs; need for additional procedures; thromboembolic phenomenon, incisional problems and other postoperative/anesthesia complications. Written informed consent was obtained.    FINDINGS:   Pelvic: External genitalia negative for lesions. Vagina negative. Adnexa negative for masses or nodularity. Cervix without gross lesions. Uterus mobile, anteverted, small.   The uterus was small and mobile.  The left ovary appeared normal. The right ovary with large cyst as expected, no adhesions. Bilateral tubes appeared normal.   ANESTHESIA:    General INTRAVENOUS FLUIDS:1100  ml ESTIMATED BLOOD LOSS:25 ml URINE OUTPUT: 300 ml  SPECIMENS: bilateral fallopian tubes and right ovary. IUD removed but not sent to pathology.  COMPLICATIONS: None immediate   PROCEDURE IN DETAIL:    This was a joint case with general surgery, who performed a robotically assisted lap chole in standard fashion. Please see his operative note for details.  After his part of the case completed, we undocked the robot and redocked into gyn position at the left lateral.  The patient was placed in deepTrendelenburg and the bowel was displaced up into the upper abdomen. The robot was left side docked. The instruments were placed under direct visualization. A hematoma on the small bowel was noted, and gen surg placed pressure on this area.  The ureters were identified bilaterally coursing outside of the operative field.   Right ovariolysis was performed and the ovary was dissected medially with care to avoid the ureter.  The infundibulopelvic ligament was skeletonized, sealed and divided.   On the left, the Fallopian tube was divided from the left ovary, and care taken to hemostatically transect the utero-ovarian ligament. The left tube was removed. The specimens were placed in a large bag and brought to the 12-mm tocar site.  Hemostasis was secured with suction-irrigation.The intraperitoneal pressure was dropped, and all planes of dissection, vascular pedicles and the vaginal cuff were found to be hemostatic.    Attention was returned to the abdomen, and the ovary and tubes were removed directly.  Gen surg returned to the consol to complete closing the fascia and evaluation of the hematoma.   The robot was undocked. The lateral trocars were removed under visualization.  The CO2 gas was released and several deep breaths given to remove any remaining CO2 from the peritoneal cavity.  The skin incisions were closed with 4-0 Monocryl subcuticular stitch and dermabond placed.  Attention was turned to the uterus from below. Operative hysteroscopy used to find the IUD, and strings grasped and removed in a single pull. The single tooth tenac was removed from the anterior lip of the cervix  and hemostasis  assured. All instruments removed from the vagina. The foley was removed.   Anesthesia was reversed without difficulty.  The patient tolerated the procedure well.  Sponge, lap and needle counts were correct x2.  The patient was taken to recovery room in excellent condition.

## 2021-02-18 NOTE — Anesthesia Preprocedure Evaluation (Addendum)
Anesthesia Evaluation  Patient identified by MRN, date of birth, ID band Patient awake    Reviewed: Allergy & Precautions, NPO status , Patient's Chart, lab work & pertinent test results  Airway Mallampati: II  TM Distance: >3 FB Neck ROM: Full    Dental no notable dental hx.    Pulmonary asthma , COPD,  COPD inhaler, Current Smoker and Patient abstained from smoking.,    Pulmonary exam normal        Cardiovascular hypertension, negative cardio ROS Normal cardiovascular exam     Neuro/Psych  Headaches, Numbness From Waist Down  Neuromuscular disease negative psych ROS   GI/Hepatic Neg liver ROS, GERD  Medicated,  Endo/Other  negative endocrine ROS  Renal/GU negative Renal ROS  negative genitourinary   Musculoskeletal negative musculoskeletal ROS (+)   Abdominal   Peds negative pediatric ROS (+)  Hematology negative hematology ROS (+) anemia ,   Anesthesia Other Findings Bell palsy    Charcot-Marie-Tooth disease   GERD (gastroesophageal reflux disease) Hypertension    Migraines       Reproductive/Obstetrics negative OB ROS                            Anesthesia Physical Anesthesia Plan  ASA: 3  Anesthesia Plan: General   Post-op Pain Management:    Induction: Intravenous  PONV Risk Score and Plan: 3 and Propofol infusion and Midazolam  Airway Management Planned: Oral ETT  Additional Equipment:   Intra-op Plan:   Post-operative Plan: Extubation in OR  Informed Consent: I have reviewed the patients History and Physical, chart, labs and discussed the procedure including the risks, benefits and alternatives for the proposed anesthesia with the patient or authorized representative who has indicated his/her understanding and acceptance.       Plan Discussed with: CRNA, Anesthesiologist and Surgeon  Anesthesia Plan Comments:         Anesthesia Quick Evaluation

## 2021-02-18 NOTE — Interval H&P Note (Signed)
History and Physical Interval Note:  02/18/2021 12:54 PM  [place]  has presented today for surgery, with the diagnosis of K80.20 Cholelithiasis w/o cholecystitis.  The various methods of treatment have been discussed with the patient and family. After consideration of risks, benefits and other options for treatment, the patient has consented to  Procedure(s): XI ROBOTIC ASSISTED LAPAROSCOPIC CHOLECYSTECTOMY (N/A) XI ROBOTIC ASSISTED BILATERAL SALPINGO OOPHORECTOMY (Right) DILATATION AND CURETTAGE /HYSTEROSCOPY (N/A) INTRAUTERINE DEVICE (IUD) REMOVAL (N/A) as a surgical intervention.  The patient's history has been reviewed, patient examined, no change in status, stable for surgery.  I have reviewed the patient's chart and labs.  Questions were answered to the patient's satisfaction.    I am not planning for bilateral oophorectomy, just the side with the cyst   Benjaman Kindler

## 2021-02-18 NOTE — Op Note (Signed)
Preoperative diagnosis: Cholelithiasis  Postoperative diagnosis: Cholelithiasis  Procedure: Robotic Assisted Laparoscopic Cholecystectomy.   Anesthesia: GETA   Surgeon: Dr. Windell Moment  Wound Classification: Clean Contaminated  Indications: Patient is a 45 y.o. female developed right upper quadrant pain and on workup was found to have cholelithiasis with a normal common duct and dilated common bile duct. MRCP negative for choledocholithiasis. Robotic Assisted Laparoscopic cholecystectomy was elected.  Findings: Dilated gallbladder Critical view of safety achieved Cystic duct and artery identified, ligated and divided Adequate hemostasis  Description of procedure: The patient was placed on the operating table in the supine position. General anesthesia was induced. A time-out was completed verifying correct patient, procedure, site, positioning, and implant(s) and/or special equipment prior to beginning this procedure. An orogastric tube was placed. The abdomen was prepped and draped in the usual sterile fashion.  An incision was made in a natural skin line below the umbilicus.  The fascia was elevated and the Veress needle inserted. Proper position was confirmed by aspiration and saline meniscus test.  The abdomen was insufflated with carbon dioxide to a pressure of 15 mmHg. The patient tolerated insufflation well. A 8-mm trocar was then inserted in optiview fashion.  The laparoscope was inserted and the abdomen inspected. No injuries from initial trocar placement were noted. Additional trocars were then inserted in the following locations: an 8-mm trocar in the left lateral abdomen, and another two 8-mm trocars to the right side of the abdomen 5 cm appart. The umbilical trocar was changed to a 12 mm trocar all under direct visualization. The abdomen was inspected and no abnormalities were found. The table was placed in the reverse Trendelenburg position with the right side up. The robotic  arms were docked and target anatomy identified. Instrument inserted under direct visualization.  Filmy adhesions between the gallbladder and omentum, duodenum and transverse colon were lysed with electrocautery. The dome of the gallbladder was grasped with a prograsp and retracted over the dome of the liver. The infundibulum was also grasped with an atraumatic grasper and retracted toward the right lower quadrant. This maneuver exposed Calot's triangle. The peritoneum overlying the gallbladder infundibulum was then incised and the cystic duct and cystic artery identified and circumferentially dissected. Critical view of safety reviewed before ligating any structure. Firefly images taken to visualize biliary ducts. The cystic duct and cystic artery were then doubly clipped and divided close to the gallbladder.  The gallbladder was then dissected from its peritoneal attachments by electrocautery. Hemostasis was checked and the gallbladder and contained stones were removed using an endoscopic retrieval bag. The gallbladder was passed off the table as a specimen.  There was no evidence of bleeding from the gallbladder fossa or cystic artery or leakage of the bile from the cystic duct stump.   Gynecology then proceeded with excision of right ovarian cyst. See separate report.   After gynecology completed oophorectomy, I proceed to close the wound. With the assistance of Dr. Trixie Rude, under direct visualization, she removed the 12 mm port and passed the PMI to close the 12 mm fascia. Secondary trocars were removed under direct vision. No bleeding was noted. The robotic arms were undoked. The scope was withdrawn and the umbilical trocar removed. The abdomen was allowed to collapse. The skin was closed with subcuticular sutures of 4-0 monocryl and topical skin adhesive. The orogastric tube was removed.  The patient tolerated the procedure well and was taken to the postanesthesia care unit in stable condition.    Specimen: Gallbladder  Complications: None  EBL: 25 mL

## 2021-02-20 NOTE — Anesthesia Postprocedure Evaluation (Signed)
Anesthesia Post Note  Patient: MARIEL LUKINS  Procedure(s) Performed: XI ROBOTIC ASSISTED LAPAROSCOPIC CHOLECYSTECTOMY (Abdomen) XI ROBOTIC ASSISTED BILATERAL SALPINGO RIGHT OOPHORECTOMY (Right) DILATATION AND CURETTAGE /HYSTEROSCOPY INTRAUTERINE DEVICE (IUD) REMOVAL  Patient location during evaluation: PACU Anesthesia Type: General Level of consciousness: awake and alert Pain management: pain level controlled Vital Signs Assessment: post-procedure vital signs reviewed and stable Respiratory status: spontaneous breathing, nonlabored ventilation, respiratory function stable and patient connected to nasal cannula oxygen Cardiovascular status: blood pressure returned to baseline and stable Postop Assessment: no apparent nausea or vomiting Anesthetic complications: no   No notable events documented.   Last Vitals:  Vitals:   02/18/21 1633 02/18/21 1650  BP: (!) 137/91 (!) 146/79  Pulse: 79 68  Resp: 16 16  Temp: 36.5 C   SpO2: 97% 97%    Last Pain:  Vitals:   02/18/21 1650  TempSrc:   PainSc: 6                  Molli Barrows

## 2021-02-21 ENCOUNTER — Encounter: Payer: Self-pay | Admitting: General Surgery

## 2021-02-21 NOTE — Progress Notes (Signed)
Patient stated her pain is not under control. 1 pill every 8 hours is not enough. I told her to call Dr. Darrol Poke office or Dr. Migdalia Dk office to see if she can get more pain meds.

## 2021-02-22 LAB — SURGICAL PATHOLOGY

## 2022-02-09 ENCOUNTER — Encounter: Payer: Self-pay | Admitting: Emergency Medicine

## 2022-02-09 ENCOUNTER — Ambulatory Visit
Admission: EM | Admit: 2022-02-09 | Discharge: 2022-02-09 | Disposition: A | Discharge status: Home / Self Care | Payer: Medicare Other | Attending: Emergency Medicine | Admitting: Emergency Medicine

## 2022-02-09 ENCOUNTER — Ambulatory Visit (INDEPENDENT_AMBULATORY_CARE_PROVIDER_SITE_OTHER): Payer: Medicare Other

## 2022-02-09 DIAGNOSIS — R200 Anesthesia of skin: Secondary | ICD-10-CM

## 2022-02-09 DIAGNOSIS — K567 Ileus, unspecified: Secondary | ICD-10-CM | POA: Diagnosis not present

## 2022-02-09 DIAGNOSIS — R202 Paresthesia of skin: Secondary | ICD-10-CM | POA: Diagnosis not present

## 2022-02-09 DIAGNOSIS — R140 Abdominal distension (gaseous): Secondary | ICD-10-CM | POA: Diagnosis not present

## 2022-02-09 NOTE — ED Triage Notes (Signed)
Pt reports bilateral hand pain, swelling and numbness x 3 days. States the numbness and pain worsened yesterday,  Also reports abdominal distention since yesterday.

## 2022-02-09 NOTE — Discharge Instructions (Signed)
On your abdominal x-ray they were able to see an ileus in your intestines and ileus is when the intestines and bowels are moving slower than normal, this potentially can lead to the layering around your intestines to burst or an obstruction to occur  You Need to go to the nearest hospital for further evaluation and management

## 2022-02-09 NOTE — ED Provider Notes (Signed)
[facility]    CSN: [id number] Arrival date & time: 02/09/22  1538      History   Chief Complaint Chief Complaint  Patient presents with   Hand Pain   Bloated    HPI Lori Moon is a 46 y.o. female.   Patient presents with numbness, tingling and severe pain to the bilateral hands for 3 days.  Endorses redness to the fingertips but denies purple discoloration that she endorsed to her PCP triage nurse earlier today.  Pain has been constant, rating a 10 out of 10.  Pain is described as a tightness almost as if her hands are filled with fluid.  Range of motion of the hand and fingers are intact.  Has not attempted treatment of symptoms.  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  COVID virus 2 weeks ago.  Denies injury or trauma to the hand or fingers.  Denies neck pain but endorses that range of motion her right side of her face intermittently becomes numb., endorses this not new symptom  Concerned with abdominal extension over the last 2 to 3 days.  Abdomen feels full and tight causing shortness of breath as it feels like it is pushing into her lungs.  Symptoms started abruptly and has gradually worsened over the last few days.  Denies abdominal pain, nausea, vomiting, diarrhea, heartburn or indigestion, increased gas production.  Last bowel movement this morning, denies sensation of constipation, endorses that frequency of bowel movements fluctuates from daily to weekly occurrence.  Has not attempted treatment of symptoms.      Past Medical History:  Diagnosis Date   Bell palsy    Charcot-Marie-Tooth disease    GERD (gastroesophageal reflux disease)    Hypertension    Migraines     Patient Active Problem List   Diagnosis Date Noted   Normocytic anemia 12/15/2018   Thrombocytosis 12/15/2018    Past Surgical History:  Procedure Laterality Date   APPENDECTOMY     HYSTEROSCOPY WITH D & C N/A 02/18/2021    Procedure: DILATATION AND CURETTAGE /HYSTEROSCOPY;  Surgeon: Benjaman Kindler, MD;  Location: [facility];  Service: Gynecology;  Laterality: N/A;   IUD REMOVAL N/A 02/18/2021   Procedure: INTRAUTERINE DEVICE (IUD) REMOVAL;  Surgeon: Benjaman Kindler, MD;  Location: [facility];  Service: Gynecology;  Laterality: N/A;   KNEE SURGERY     KNEE SURGERY Right    ROBOTIC ASSISTED BILATERAL SALPINGO OOPHERECTOMY Right 02/18/2021   Procedure: XI ROBOTIC ASSISTED BILATERAL SALPINGO RIGHT OOPHORECTOMY;  Surgeon: Benjaman Kindler, MD;  Location: [facility];  Service: Gynecology;  Laterality: Right;   shoulder surgery, left  11/2019   TONSILLECTOMY      OB History   No obstetric history on file.      Home Medications    Prior to Admission medications   Medication Sig Start Date End Date Taking? Authorizing Provider  albuterol (VENTOLIN HFA) 108 (90 Base) MCG/ACT inhaler Inhale 2 puffs into the lungs every 6 (six) hours as needed for wheezing or shortness of breath. 10/18/18   [provider]  cephALEXin (KEFLEX) 500 MG capsule Take 500 mg by mouth daily. 11/17/20   [provider]  docusate sodium (COLACE) 100 MG capsule Take 1 capsule (100 mg total) by mouth 2 (two) times daily. To keep stools soft 02/18/21   Benjaman Kindler, MD  gabapentin (NEURONTIN) 600 MG tablet Take 1,200 mg by mouth 3 (three) times daily. 11/22/18   [provider]  Melatonin 10 MG TABS Take 10 capsules by mouth at bedtime. 10/12/18   [provider]  Multiple Vitamins-Minerals (MULTIVITAMIN WITH MINERALS) tablet Take 1 tablet by mouth daily.    [provider]  omeprazole (PRILOSEC) 40 MG capsule Take 40 mg by mouth 2 (two) times daily before a meal.    [provider]  ondansetron (ZOFRAN-ODT) 4 MG disintegrating tablet Take 4 mg by mouth every 8 (eight) hours as needed for nausea/vomiting. 01/17/21   [provider]  oxyCODONE (ROXICODONE) 5 MG immediate release tablet Take  1 tablet (5 mg total) by mouth every 8 (eight) hours as needed. 02/18/21 02/18/22  Herbert Pun, MD  pantoprazole (PROTONIX) 40 MG tablet Take 1 tablet by mouth daily. Patient not taking: Reported on 02/18/2021 11/13/18 02/18/21  [provider]  traZODone (DESYREL) 50 MG tablet Take 50 mg by mouth at bedtime. 11/13/18 02/18/21  [provider]  vitamin B-12 (CYANOCOBALAMIN) 1000 MCG tablet Take 1,000 mcg by mouth daily.    [provider]    Family History Family History  Problem Relation Age of Onset   Other Mother        unknown medical history   Diabetes Father    Multiple sclerosis Father    Cancer Father    Heart disease Father     Social History Social History   Tobacco Use   Smoking status: Every Day    Packs/day: 0.50    Types: Cigarettes   Smokeless tobacco: Never  Vaping Use   Vaping Use: Every day   Start date: 10/27/2018   Substances: Nicotine, Flavoring   Devices: Unknown  Substance Use Topics   Alcohol use: Not Currently    Alcohol/week: 14.0 standard drinks of alcohol    Types: 14 Cans of beer per week    Comment: patient quit drinking 10/2017   Drug use: No     Allergies   Acetaminophen-codeine, Codeine, Elemental sulfur, Sulfa antibiotics, and Sumatriptan   Review of Systems Review of Systems  Constitutional: Negative.   HENT: Negative.    Respiratory:  Positive for shortness of breath. Negative for apnea, cough, choking, chest tightness, wheezing and stridor.   Gastrointestinal:  Positive for abdominal distention. Negative for abdominal pain, anal bleeding, blood in stool, constipation, diarrhea, nausea, rectal pain and vomiting.  Musculoskeletal: Negative.  Negative for arthralgias, back pain, gait problem, joint swelling, myalgias, neck pain and neck stiffness.  Skin: Negative.   Neurological:  Positive for numbness. Negative for dizziness, tremors, seizures, syncope, facial asymmetry, speech difficulty, weakness,  light-headedness and headaches.     Physical Exam Triage Vital Signs ED Triage Vitals  Enc Vitals Group     BP 02/09/22 1601 (!) 169/109     Pulse Rate 02/09/22 1601 (!) 110     Resp 02/09/22 1601 16     Temp 02/09/22 1601 98.8 F (37.1 C)     Temp Source 02/09/22 1601 Oral     SpO2 02/09/22 1601 96 %     Weight --      Height --      Head Circumference --      Peak Flow --      Pain Score 02/09/22 1600 6     Pain Loc --      Pain Edu? --      Excl. in [place]? --    No data found.  Updated Vital Signs BP (!) 169/109 (BP Location: Left Arm)   Pulse (!) 110  Temp 98.8 F (37.1 C) (Oral)   Resp 16   SpO2 96%   Visual Acuity Right Eye Distance:   Left Eye Distance:   Bilateral Distance:    Right Eye Near:   Left Eye Near:    Bilateral Near:     Physical Exam Constitutional:      Appearance: Normal appearance.  Cardiovascular:     Pulses: Normal pulses.     Heart sounds: Normal heart sounds.  Pulmonary:     Effort: Pulmonary effort is normal.     Breath sounds: Normal breath sounds.  Abdominal:     Tenderness: There is no right CVA tenderness, left CVA tenderness or guarding.     Comments: Moderate to severe distention of the abdomen, no tenderness or masses noted,   Musculoskeletal:     Comments: Range of motion of the bilateral hands and fingers intact, decreased sensation of all 10 fingertips, sensation intact to the palm of hand and arms, strength is a 5 out of 5 bilaterally and is symmetrical, very mild erythema noted to the fingertips, skin is warm to touch  Neurological:     Mental Status: She is alert and oriented to person, place, and time. Mental status is at baseline.  Psychiatric:        Mood and Affect: Mood normal.        Behavior: Behavior normal.      UC Treatments / Results  Labs (all labs ordered are listed, but only abnormal results are displayed) Labs Reviewed - No data to display  EKG   Radiology No results  found.  Procedures Procedures (including critical care time)  Medications Ordered in UC Medications - No data to display  Initial Impression / Assessment and Plan / UC Course  I have reviewed the triage vital signs and the nursing notes.  Pertinent labs & imaging results that were available during my care of the patient were reviewed by me and considered in my medical decision making (see chart for details).  Clinical Course as of 02/09/22 1704  Thu Feb 09, 2022  1646 DG Abdomen 1 View [AW]    Clinical Course User Index [AW] Lowella Petties R, NP    Ileus Numbness and tingling in both hands  Abdominal x-ray to confirm ileus, patient sent to the nearest emergency department for further evaluation and management, currently in no signs of distress, able to escort self, endorses that she will go to [facility] for treatment Final Clinical Impressions(s) / UC Diagnoses   Final diagnoses:  None   Discharge Instructions   None    ED Prescriptions   None    PDMP not reviewed this encounter.   Hans Eden, NP 02/09/22 1711

## 2022-08-02 ENCOUNTER — Other Ambulatory Visit: Payer: Self-pay | Admitting: Internal Medicine

## 2022-08-02 ENCOUNTER — Ambulatory Visit
Admission: RE | Admit: 2022-08-02 | Discharge: 2022-08-02 | Disposition: A | Discharge status: Home / Self Care | Payer: Medicare Other | Attending: Internal Medicine | Admitting: Internal Medicine

## 2022-08-02 ENCOUNTER — Ambulatory Visit
Admission: RE | Admit: 2022-08-02 | Discharge: 2022-08-02 | Disposition: A | Discharge status: Home / Self Care | Payer: Medicare Other | Source: Ambulatory Visit | Attending: Internal Medicine | Admitting: Internal Medicine

## 2022-08-02 DIAGNOSIS — R051 Acute cough: Secondary | ICD-10-CM

## 2022-09-05 ENCOUNTER — Other Ambulatory Visit: Payer: Self-pay | Admitting: Family Medicine

## 2022-09-05 DIAGNOSIS — Z1231 Encounter for screening mammogram for malignant neoplasm of breast: Secondary | ICD-10-CM

## 2022-09-13 ENCOUNTER — Ambulatory Visit: Payer: Medicare Other

## 2022-09-19 ENCOUNTER — Ambulatory Visit
Admission: RE | Admit: 2022-09-19 | Discharge: 2022-09-19 | Disposition: A | Discharge status: Home / Self Care | Payer: Medicare Other | Source: Ambulatory Visit | Attending: Family Medicine | Admitting: Family Medicine

## 2022-09-19 DIAGNOSIS — Z1231 Encounter for screening mammogram for malignant neoplasm of breast: Secondary | ICD-10-CM | POA: Diagnosis not present

## 2022-09-21 ENCOUNTER — Other Ambulatory Visit: Payer: Self-pay | Admitting: Family Medicine

## 2022-09-21 DIAGNOSIS — N63 Unspecified lump in unspecified breast: Secondary | ICD-10-CM

## 2022-09-21 DIAGNOSIS — R928 Other abnormal and inconclusive findings on diagnostic imaging of breast: Secondary | ICD-10-CM

## 2022-09-26 ENCOUNTER — Ambulatory Visit
Admission: RE | Admit: 2022-09-26 | Discharge: 2022-09-26 | Disposition: A | Discharge status: Home / Self Care | Payer: Medicare Other | Source: Ambulatory Visit | Attending: Family Medicine | Admitting: Family Medicine

## 2022-09-26 DIAGNOSIS — N63 Unspecified lump in unspecified breast: Secondary | ICD-10-CM | POA: Diagnosis present

## 2022-09-26 DIAGNOSIS — R928 Other abnormal and inconclusive findings on diagnostic imaging of breast: Secondary | ICD-10-CM | POA: Insufficient documentation

## 2022-11-28 ENCOUNTER — Other Ambulatory Visit: Payer: Self-pay | Admitting: Family Medicine

## 2022-11-28 DIAGNOSIS — Z1231 Encounter for screening mammogram for malignant neoplasm of breast: Secondary | ICD-10-CM

## 2023-07-29 DEATH — deceased

## 2023-10-10 IMAGING — CR DG ABDOMEN 1V
1 series · 1 of 1 positions shown · non-contrast
Comparison: CT done on 01/22/2018

CLINICAL DATA: Abdominal distention

EXAM:
ABDOMEN - 1 VIEW, upright

[abdomen kub]
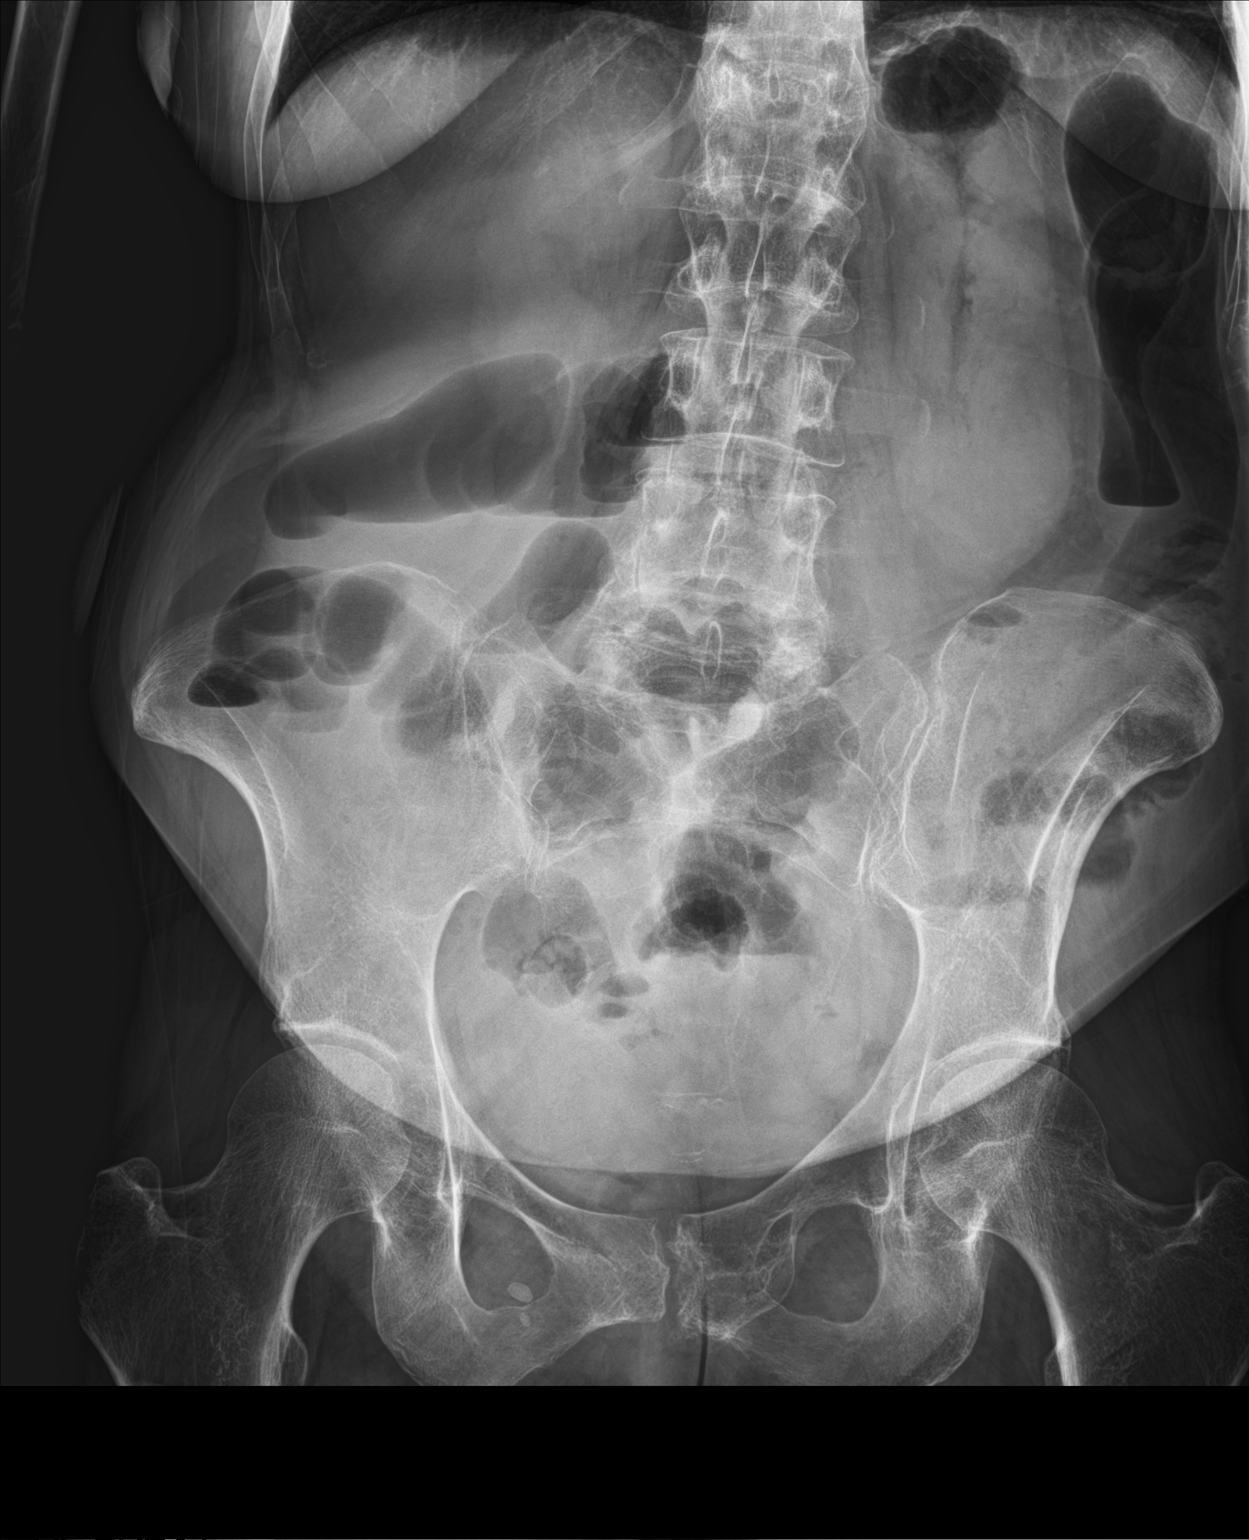

[1 of 1 positions shown; findings below may reference images not displayed]

FINDINGS: There is mild dilation of small-bowel loops. There are air-fluid
levels in the colon. Stomach is not distended. No abnormal masses or
calcifications are seen. Kidneys are partly obscured by bowel
contents.
IMPRESSION: There is mild dilation of small-bowel loops suggesting possible
ileus.
